# Patient Record
Sex: Male | Born: 1980
Health system: Southern US, Community
[De-identification: ages and names within clinical notes are randomized; demographics above are authoritative.]

## PROBLEM LIST (undated history)

## (undated) DIAGNOSIS — K529 Noninfective gastroenteritis and colitis, unspecified: Secondary | ICD-10-CM

---

## 2002-01-06 HISTORY — PX: ANTERIOR CRUCIATE LIGAMENT REPAIR: SHX115

## 2005-04-21 ENCOUNTER — Ambulatory Visit (HOSPITAL_COMMUNITY): Admission: RE | Admit: 2005-04-21 | Discharge: 2005-04-21 | Payer: Self-pay | Admitting: Family Medicine

## 2012-08-16 ENCOUNTER — Ambulatory Visit (HOSPITAL_COMMUNITY)
Admission: RE | Admit: 2012-08-16 | Discharge: 2012-08-16 | Disposition: A | Discharge status: Home / Self Care | Payer: BC Managed Care – PPO | Source: Ambulatory Visit | Attending: Family Medicine | Admitting: Family Medicine

## 2012-08-16 ENCOUNTER — Encounter: Payer: Self-pay | Admitting: Family Medicine

## 2012-08-16 ENCOUNTER — Ambulatory Visit (INDEPENDENT_AMBULATORY_CARE_PROVIDER_SITE_OTHER): Payer: BC Managed Care – PPO | Admitting: Family Medicine

## 2012-08-16 VITALS — BP 122/88 | HR 70 | Ht 68.0 in | Wt 190.4 lb

## 2012-08-16 DIAGNOSIS — Z9889 Other specified postprocedural states: Secondary | ICD-10-CM | POA: Insufficient documentation

## 2012-08-16 DIAGNOSIS — M25569 Pain in unspecified knee: Secondary | ICD-10-CM

## 2012-08-16 DIAGNOSIS — M25562 Pain in left knee: Secondary | ICD-10-CM

## 2012-08-16 NOTE — Progress Notes (Signed)
  Subjective:    Patient ID: Antonio Gallegos, male    DOB: November 02, 1980, 32 y.o.   MRN: [id number]  Knee Pain  The incident occurred more than 1 week ago. The incident occurred at home. There was no injury mechanism (Patient had anterior cruciate ligament reconstruction 2004.). The pain is present in the left knee. The quality of the pain is described as aching and shooting. The pain is at a severity of 5/10. The pain is moderate. The pain has been constant since onset. Associated symptoms include a loss of motion. The symptoms are aggravated by movement. He has tried acetaminophen for the symptoms. The treatment provided no relief.   This patient states when he tries to golf when he tries to sprint or twist he relates severe pain in the knee. He also relates that the knee gives way. gives way. He also relates a lot of popping in the knee and at times it locks on him. He denies any numbness down into the ankle.  PMH previous knee injury. Patient would like to get to the point where he can exercise without trouble.   Review of Systems See above    Objective:   Physical Exam Thighs normal calf is normal ankle normal moderate tenderness in the knee with significant crepitus plus also clunk on examination with flexion and internal rotation       Assessment & Plan:  Probable cartilage damage we will do plain x-rays may well need to have MRI as well consider possibility of anti-inflammatory trial patient may well end up needing orthopedic referral

## 2012-08-17 ENCOUNTER — Other Ambulatory Visit: Payer: Self-pay | Admitting: Family Medicine

## 2012-08-17 DIAGNOSIS — M25562 Pain in left knee: Secondary | ICD-10-CM

## 2012-09-01 ENCOUNTER — Encounter: Payer: Self-pay | Admitting: Orthopedic Surgery

## 2012-09-01 ENCOUNTER — Ambulatory Visit (INDEPENDENT_AMBULATORY_CARE_PROVIDER_SITE_OTHER): Payer: BC Managed Care – PPO | Admitting: Orthopedic Surgery

## 2012-09-01 VITALS — BP 122/75 | Ht 68.0 in | Wt 187.0 lb

## 2012-09-01 DIAGNOSIS — S83242A Other tear of medial meniscus, current injury, left knee, initial encounter: Secondary | ICD-10-CM

## 2012-09-01 DIAGNOSIS — IMO0002 Reserved for concepts with insufficient information to code with codable children: Secondary | ICD-10-CM

## 2012-09-01 NOTE — Progress Notes (Signed)
Patient ID: Antonio Gallegos, male   DOB: 10/12/1980, 32 y.o.   MRN: [id number]  Chief Complaint  Patient presents with  . Knee Pain    Left knee pain d/t re- injury back in October 2013. referral from Dr. Lilyan Punt.    32 year old male had a still reconstruction secondary to a noncontact basketball injury in 2004. The surgery was done in [place]. He had hamstring graft with a cross BX femoral fixation he did well. He had lateral meniscal tear as well and partial lateral meniscectomy.  He's been doing reasonably well over the years able to return to sporting activity and exercise routines including weight lifting squatting etc. He reinjured himself in 2007 but doesn't remember the injury, we have an MRI showing that the graft was intact at that time.  In October 2013 he was exercising with some weighted squats he felt a catching sensation and had severe swelling in the knee when he was golfing the next day he noted a crack or pop in the knee and he  had trouble for 2 weeks  After that he got better but he notices that he can't pivot turn or rotate on the knee. He now has to use a lighter amount of weights when squatting. Review of systems negative except for seasonal allergies  He has no known drug allergies no medical problems and his anterior cruciate ligament reconstruction left knee as stated. Family history of heart disease, arthritis, cancer and asthma. 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   Range of motion is full without contracture.  Motor exam is normal with grade 5 strength.  The joints are fully reduced without subluxation.  There is no atrophy or tremor and muscle tone is normal.  All joints are stable.  Right knee normal alignment no effusion  full range of motion stability tests were normal with a possible clot on the pivot maneuver but stable strength normal skin normal pulses normal lymph nodes normal sensation normal  Left knee a medial scar from the hamstring graft is noted its larger than what we normally see but nothing out of the ordinary per se he also has a scar across the front of the leg a little lower and more lateral to that. He has no effusion the knee today is no medial or lateral joint line tenderness. His Lachman test shows slight more translation than the opposite side but comes to a firm endpoint he does have a pivot shift grade 1 versus a glide on the opposite side full range of motion is noted. Strength is normal pulses are good lymph nodes negative sensation intact McMurray's sign is positive for medial meniscal tear   No pathologic reflexes normal coordination and balance  X-ray shows slightly anterior tibial graft but otherwise normal there is a screw from a tea fix her cross fix fixation on the femoral side  2007 MRI shows decrease meniscal tissue consistent with lateral meniscectomy partial. 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

## 2012-09-01 NOTE — Patient Instructions (Signed)
MRI Ordered. 

## 2012-09-02 ENCOUNTER — Telehealth: Payer: Self-pay | Admitting: *Deleted

## 2012-09-02 NOTE — Telephone Encounter (Signed)
Antonio Gallegos called and said, he is still working with his insurance company to find a  facility that they will pay for. I told him i would hold off on pre-certing his MRI until I heard back from him. I did explain to Antonio Gallegos that I would be out of the office until Tuesday.

## 2012-09-07 ENCOUNTER — Telehealth: Payer: Self-pay | Admitting: Orthopedic Surgery

## 2012-09-07 NOTE — Telephone Encounter (Signed)
Patient called back to relay that he has researched some MRI facilities regarding in-network providers and also based on cost/copay; he requests MRI to be done at [facility] in [place], [facility] # [phone], if his insurance approves. Relayed we will contact insurer [employer] and proceed with process, and call him with further information.

## 2012-09-08 ENCOUNTER — Telehealth: Payer: Self-pay | Admitting: *Deleted

## 2012-09-08 ENCOUNTER — Other Ambulatory Visit: Payer: Self-pay | Admitting: *Deleted

## 2012-09-08 NOTE — Progress Notes (Signed)
Error

## 2012-09-08 NOTE — Telephone Encounter (Signed)
No per-cert required for MRI per [name] A with [employer]. MRI scheduled at [facility] for 09/09/12 at 8:30 pm, per patient's request. Scheduled for follow up with Dr. Romeo Apple 09/20/12 at 9:00 am Patient is aware of dates and times

## 2012-09-09 NOTE — Telephone Encounter (Signed)
Refer to notes entered by Debby Bud, LPN regarding scheduling of MRI at this facility. Patient aware.

## 2012-09-20 ENCOUNTER — Ambulatory Visit (INDEPENDENT_AMBULATORY_CARE_PROVIDER_SITE_OTHER): Payer: BC Managed Care – PPO | Admitting: Orthopedic Surgery

## 2012-09-20 VITALS — BP 126/78 | Ht 68.0 in | Wt 187.0 lb

## 2012-09-20 DIAGNOSIS — M1710 Unilateral primary osteoarthritis, unspecified knee: Secondary | ICD-10-CM

## 2012-09-20 DIAGNOSIS — M179 Osteoarthritis of knee, unspecified: Secondary | ICD-10-CM

## 2012-09-20 DIAGNOSIS — Z9889 Other specified postprocedural states: Secondary | ICD-10-CM

## 2012-09-20 DIAGNOSIS — IMO0002 Reserved for concepts with insufficient information to code with codable children: Secondary | ICD-10-CM

## 2012-09-20 NOTE — Patient Instructions (Addendum)
CONTINUE TO MONITOR KNEE FOR 6 WEEKS, TRY GOLFING WATCH FOR LOCKING, CATCHING IF NO BETTER CALL OUR OFFICE GLUCOSAMINE AND CHONDROITIN

## 2012-09-21 ENCOUNTER — Encounter: Payer: Self-pay | Admitting: Orthopedic Surgery

## 2012-09-21 DIAGNOSIS — Z9889 Other specified postprocedural states: Secondary | ICD-10-CM | POA: Insufficient documentation

## 2012-09-21 DIAGNOSIS — M1710 Unilateral primary osteoarthritis, unspecified knee: Secondary | ICD-10-CM | POA: Insufficient documentation

## 2012-09-21 DIAGNOSIS — M179 Osteoarthritis of knee, unspecified: Secondary | ICD-10-CM | POA: Insufficient documentation

## 2012-09-21 NOTE — Progress Notes (Signed)
Patient ID: Antonio Gallegos, male   DOB: October 02, 1980, 32 y.o.   MRN: [id number]  Followup MRI recheck knee  MRI OF THE LEFT KNEE WITHOUT CONTRAST - 04/21/05:    Technique: Multiplanar and multisequence MR imaging of the left knee was performed following the standard protocol. No intravenous contrast was administered.    There are no comparison studies available.   Findings: Postsurgical changes secondary to ACL repair are noted. The graft is intact. There is mild edema along the graft which may reflect ligamentous sprain. There is an acute bone contusion along the lateral femoral condyle with associated osteochondral defect. A focal articular cartilage defect is noted without unsteady osseous fragment. The lateral meniscus is markedly diminutive, attributed to prior partial meniscectomy. There is no definite recurrent meniscal tear appreciated. The lateral collateral ligament complex is intact.   Within the medial compartment, the meniscus has a normal appearance without tear. The MCL is intact. There is cartilage thinning without full-thickness cartilage loss.   The patella is in normal position.   IMPRESSION:    1. ACL graft remains intact. There is edema along the graft suggesting sprain. Extensive bone contusion along the lateral femoral condyle is noted with overlying osteocartilage injury. No unstable osseous component.    2. The lateral meniscus is notably diminutive. There is no definite recurrent meniscal tear or displaced meniscal fragments.    BP 126/78  Ht 5\' 8"  (1.727 m)  Wt 187 lb (84.823 kg)  BMI 28.44 kg/m2 General appearance is normal, the patient is alert and oriented x3 with normal mood and affect. Knee exam essentially unchanged joint line symptoms are modest stability of the knee shows mild laxity does not appear to be pathologic neurovascular exam intact  Review MRI and discussion with patient I think he  can avoid surgery at this point but as we discussed he may need surgery in  the future perhaps debridement of his joint. At his age of 32 is not a candidate for surgery in terms of replacement  His cartilaginous deficits in defects which will progress over time. Recommend over-the-counter chondral supplements at this point maintain knee stability by strengthening the knee and continue distended shape and keep his weight down  Followup as needed look for catching locking giving way mechanical symptoms if he hasn't made to call the office back he understands this plan

## 2012-10-08 ENCOUNTER — Encounter: Payer: Self-pay | Admitting: Orthopedic Surgery

## 2013-03-29 ENCOUNTER — Ambulatory Visit (INDEPENDENT_AMBULATORY_CARE_PROVIDER_SITE_OTHER): Payer: BC Managed Care – PPO | Admitting: Family Medicine

## 2013-03-29 ENCOUNTER — Encounter: Payer: Self-pay | Admitting: Family Medicine

## 2013-03-29 VITALS — BP 118/72 | Ht 68.0 in | Wt 194.2 lb

## 2013-03-29 DIAGNOSIS — R197 Diarrhea, unspecified: Secondary | ICD-10-CM

## 2013-03-29 DIAGNOSIS — R109 Unspecified abdominal pain: Secondary | ICD-10-CM

## 2013-03-29 LAB — CBC WITH DIFFERENTIAL/PLATELET
BASOS ABS: 0 10*3/uL (ref 0.0–0.1)
BASOS PCT: 0 % (ref 0–1)
EOS PCT: 1 % (ref 0–5)
Eosinophils Absolute: 0.1 10*3/uL (ref 0.0–0.7)
HEMATOCRIT: 39.8 % (ref 39.0–52.0)
HEMOGLOBIN: 13.5 g/dL (ref 13.0–17.0)
LYMPHS PCT: 25 % (ref 12–46)
Lymphs Abs: 1.5 10*3/uL (ref 0.7–4.0)
MCH: 29.8 pg (ref 26.0–34.0)
MCHC: 33.9 g/dL (ref 30.0–36.0)
MCV: 87.9 fL (ref 78.0–100.0)
MONO ABS: 0.5 10*3/uL (ref 0.1–1.0)
Monocytes Relative: 8 % (ref 3–12)
NEUTROS ABS: 3.8 10*3/uL (ref 1.7–7.7)
NEUTROS PCT: 66 % (ref 43–77)
Platelets: 253 10*3/uL (ref 150–400)
RBC: 4.53 MIL/uL (ref 4.22–5.81)
RDW: 13.5 % (ref 11.5–15.5)
WBC: 5.8 10*3/uL (ref 4.0–10.5)

## 2013-03-29 LAB — SEDIMENTATION RATE: Sed Rate: 5 mm/hr (ref 0–16)

## 2013-03-29 LAB — HEPATIC FUNCTION PANEL
ALBUMIN: 4.3 g/dL (ref 3.5–5.2)
ALK PHOS: 52 U/L (ref 39–117)
ALT: 25 U/L (ref 0–53)
AST: 28 U/L (ref 0–37)
BILIRUBIN DIRECT: 0.1 mg/dL (ref 0.0–0.3)
Indirect Bilirubin: 0.3 mg/dL (ref 0.2–1.2)
Total Bilirubin: 0.4 mg/dL (ref 0.2–1.2)
Total Protein: 7 g/dL (ref 6.0–8.3)

## 2013-03-29 NOTE — Progress Notes (Signed)
   Subjective:    Patient ID: Antonio Gallegos, male    DOB: 01-03-1981, 33 y.o.   MRN: [id number]  HPI There is no prior history of this problem Patient arrives with complaint of abd pain for few months. Patient reports indigestion and diarrhea. No nausea Tried Immodium- slowed it a little Feeling OK No camping, denies any severe abdominal pain discomfort denies bloody stools denies mucousy stools. States even tried stopping milk products periodically for couple weeks and it does seem to help. Denies being under stress denies being depressed or anxious No night sweats No travel, Review of Systems  Constitutional: Negative for fever, activity change, appetite change and fatigue.  HENT: Negative for congestion.   Respiratory: Negative for cough, choking and shortness of breath.   Cardiovascular: Negative for chest pain.  Gastrointestinal: Positive for diarrhea. Negative for vomiting, abdominal pain, constipation, blood in stool and rectal pain.  Genitourinary: Negative for frequency and flank pain.  Musculoskeletal: Negative for arthralgias.  Skin: Negative for rash.  Neurological: Negative for headaches.   family history benign.     Objective:   Physical Exam  Vitals reviewed. Constitutional: He appears well-nourished. No distress.  Cardiovascular: Normal rate, regular rhythm and normal heart sounds.   No murmur heard. Pulmonary/Chest: Effort normal and breath sounds normal. No respiratory distress.  Abdominal: Soft. He exhibits no distension. There is no tenderness. There is no rebound.  Musculoskeletal: He exhibits no edema.  Lymphadenopathy:    He has no cervical adenopathy.  Neurological: He is alert.  Psychiatric: His behavior is normal.          Assessment & Plan:  Diarrhea-seemingly it seems a little bit better than what it was. We will go ahead and do some testing. If this gets worse over the course of next couple weeks he will need a stool culture, stool WBC, O&P.  And if that does not detail out the problem gastroenterology referral. Lab work was ordered to help rule out celiac disease also lab work ordered to check on couple different items. 25 minutes was spent with the patient regarding this issue. Moderate to significant complexity.

## 2013-03-30 LAB — TISSUE TRANSGLUTAMINASE, IGA: Tissue Transglutaminase Ab, IgA: 8.6 U/mL (ref <20)

## 2013-04-06 NOTE — Progress Notes (Signed)
Patient notified and verbalized understanding. He is dropping off his stool samples to the lab today. He is still having loose stools. I said for him to call us back Monday the 6th if he doesn't here back from [place] and if he is still having loose stools.

## 2013-04-07 ENCOUNTER — Telehealth: Payer: Self-pay | Admitting: Family Medicine

## 2013-04-07 ENCOUNTER — Other Ambulatory Visit (INDEPENDENT_AMBULATORY_CARE_PROVIDER_SITE_OTHER): Payer: BC Managed Care – PPO | Admitting: *Deleted

## 2013-04-07 DIAGNOSIS — R197 Diarrhea, unspecified: Secondary | ICD-10-CM

## 2013-04-07 DIAGNOSIS — R109 Unspecified abdominal pain: Secondary | ICD-10-CM

## 2013-04-07 NOTE — Telephone Encounter (Signed)
Stool cx generally take several days to return, one night of sweats will not change approach, rec waiting for stool results before further intervent and this gives time to see if symptoms persist

## 2013-04-07 NOTE — Telephone Encounter (Signed)
Patient was seen on 3/24 by Dr. Lorin PicketScott for loose stools. He dropped off his stool culture yesterday to the lab. Dr. Lorin PicketScott told Loraine LericheMark to let us know if he wakes up in sweats throughout the night and last night he did.

## 2013-04-07 NOTE — Telephone Encounter (Signed)
Patient came in for a stomach issue that he has had.  He said at that time, he had not been waking up in sweats at night, but last night and he did and he feels like it is getting worse now. Please advise.

## 2013-04-07 NOTE — Telephone Encounter (Signed)
Patient called back. I see that he never got the tubes for the stool culture. I see Dr. Roby Lofts note from visit. I went ahead and put the orders in for a stool culture, stool WBC, and O&P. I notified [employer] and he is coming to pick up the tubes for the culture.

## 2013-04-07 NOTE — Telephone Encounter (Signed)
Left VM on patient's cell phone with Dr. Soyla Dryer response.

## 2013-04-08 LAB — POC HEMOCCULT BLD/STL (HOME/3-CARD/SCREEN)
Card #3 Fecal Occult Blood, POC: NEGATIVE
FECAL OCCULT BLD: NEGATIVE
Fecal Occult Blood, POC: NEGATIVE

## 2013-04-13 LAB — FECAL LACTOFERRIN, QUANT: LACTOFERRIN: POSITIVE

## 2013-04-15 ENCOUNTER — Telehealth: Payer: Self-pay | Admitting: Family Medicine

## 2013-04-15 NOTE — Telephone Encounter (Signed)
Hemoccult test is negative, awaiting stool culture.

## 2013-04-15 NOTE — Telephone Encounter (Signed)
Patient came in about stomach issues last month and he said he dropped his tests off. He would like for a nurse to call him back to update the status of his condition.

## 2013-04-16 LAB — STOOL CULTURE

## 2013-04-19 NOTE — Telephone Encounter (Signed)
This patient's stool culture is negative I recommend that we go ahead and set him up with gastroenterology. Please put in referral for whoever he prefers. If his condition has totally cleared up then referral is not necessary.

## 2013-04-20 NOTE — Telephone Encounter (Signed)
Patient stated it has gotten a lot better -not 100 percent but a lot better and wants to hold on GI referral at this time. Patient to call back if further problems or if he wants to proceed with referral.

## 2013-04-20 NOTE — Telephone Encounter (Signed)
Left message to return call 

## 2013-04-25 ENCOUNTER — Telehealth: Payer: Self-pay | Admitting: Family Medicine

## 2013-04-25 ENCOUNTER — Other Ambulatory Visit: Payer: Self-pay | Admitting: Family Medicine

## 2013-04-25 DIAGNOSIS — R109 Unspecified abdominal pain: Secondary | ICD-10-CM

## 2013-04-25 NOTE — Telephone Encounter (Signed)
A referral was put into the system. Please talk with the patient. Confirm [facility] Dr. Karilyn Cotaehman? Dr. Kendell Baneourke? We can go ahead and get this set up.

## 2013-04-25 NOTE — Telephone Encounter (Signed)
Pt didn't have a preference between rourke or rehman. Whichever one your think is best. He also wanted to let u know. He has been waking at night with sweats. Started about 3 weeks ago. Happens about once a week. He stated you asked him about this at his appt but he was not having this at that time.

## 2013-04-25 NOTE — Telephone Encounter (Signed)
[facility] 04/25/13 

## 2013-04-25 NOTE — Telephone Encounter (Signed)
Patient would like to go ahead with referral to GI.

## 2013-04-26 ENCOUNTER — Encounter: Payer: Self-pay | Admitting: Gastroenterology

## 2013-05-26 ENCOUNTER — Ambulatory Visit (INDEPENDENT_AMBULATORY_CARE_PROVIDER_SITE_OTHER): Payer: BC Managed Care – PPO | Admitting: Gastroenterology

## 2013-05-26 ENCOUNTER — Encounter: Payer: Self-pay | Admitting: Gastroenterology

## 2013-05-26 VITALS — BP 137/76 | HR 50 | Temp 97.4°F | Ht 68.0 in | Wt 186.6 lb

## 2013-05-26 DIAGNOSIS — R195 Other fecal abnormalities: Secondary | ICD-10-CM | POA: Insufficient documentation

## 2013-05-26 LAB — TSH: TSH: 1.722 u[IU]/mL (ref 0.350–4.500)

## 2013-05-26 MED ORDER — DICYCLOMINE HCL 10 MG PO CAPS: 10.0000 mg | ORAL_CAPSULE | Freq: Three times a day (TID) | ORAL | Status: DC

## 2013-05-26 NOTE — Assessment & Plan Note (Signed)
33 year old pleasant male with significant change in bowel habits for the past 4 months, noting chronic, non-bloody loose stool. Thus far, stool culture negative but with a non-specific positive lactoferrin. Mild abdominal cramping noted but not significant; he has not noticed any aggravating factors but does note some improvement with Imodium. Doubt infectious process such as Cdiff, but we will check this by PCR to be thorough. I have also ordered an IgA level to assess for IgA deficiency, which could render a false negative TTg, IgA. Add TSH to labs, although I doubt thyroid abnormalities are any issue here. Likely dealing with chronic diarrhea, possible IBS, doubt IBD. We discussed adding Bentyl for supportive measures, probiotic daily, and pursuing a flex sig vs colonoscopy if no improvement with supportive measures. He is agreeable to this plan.

## 2013-05-26 NOTE — Progress Notes (Signed)
Referring Provider: Babs Sciara, MD Primary Care Physician:  Lilyan Punt, MD Primary Gastroenterologist:  Dr. Darrick Penna   Chief Complaint  Patient presents with  . Abdominal Pain    HPI:   Antonio Gallegos presents today at the request of Dr. Lilyan Punt secondary to diarrhea X 4 months. Prior to this 1 soft BM a day. Thus far, stool culture negative and lactoferrin positive. Hemoccult negative X 3. CBC normal. TTg, IgA normal. No IgA drawn. No changes in medication, no changes in diet. Denies any stress. No rectal bleeding. Sometimes loose stools 2-3 times per day, sometimes up to 4 times per day. Usually watery. Would have occasional, rare formed bowel movements for a few days but then right back to watery. Postprandial component. No abx exposure. Well water. No sick contacts. No significant abdominal pain. Sometimes mild abdominal cramping but not severe. No weight loss. Good appetite.   History reviewed. No pertinent past medical history.  Past Surgical History  Procedure Laterality Date  . Anterior cruciate ligament repair Left 2004    [facility]    No current outpatient prescriptions on file.   No current facility-administered medications for this visit.    Allergies as of 05/26/2013 - Review Complete 05/26/2013  Allergen Reaction Noted  . Other  08/16/2012    Family History  Problem Relation Age of Onset  . Colon cancer Neg Hx   . Celiac disease Neg Hx     History   Social History  . Marital Status: Single    Spouse Name: N/A    Number of Children: N/A  . 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 Smoking status: Former Games developer  . Smokeless tobacco: Not on file  . Alcohol Use: Yes     Comment: Ocassionally  . Drug Use: No  . Sexual Activity: Not on file   Other Topics Concern  . Not on file   Social History Narrative  . No narrative on file    Review of  Systems: As mentioned in HPI  Physical Exam: BP 137/76  Pulse 50  Temp(Src) 97.4 F (36.3 C) (Oral)  Ht 5\' 8"  (1.727 m)  Wt 186 lb 9.6 oz (84.641 kg)  BMI 28.38 kg/m2 General:   Alert and oriented. Well-developed, well-nourished, pleasant and cooperative. Head:  Normocephalic and atraumatic. Eyes:  Conjunctiva pink, sclera clear, no icterus.   Conjunctiva pink. Ears:  Normal auditory acuity. Nose:  No deformity, discharge,  or lesions. Mouth:  No deformity or lesions, mucosa pink and moist.  Lungs:  Clear to auscultation bilaterally, without wheezing, rales, or rhonchi.  Heart:  S1, S2 present without murmurs noted.  Abdomen:  +BS, soft, non-tender and non-distended. Without mass or HSM. No rebound or guarding. No hernias noted. Rectal:  Deferred  Msk:  Symmetrical without gross deformities. Normal posture. Extremities:  Without clubbing or edema. Neurologic:  Alert and  oriented x4;  grossly normal neurologically. Skin:  Intact, warm and dry without significant lesions or rashes Psych:  Alert and cooperative. Normal mood and affect.   Lab Results  Component Value Date   WBC 5.8 03/29/2013   HGB 13.5 03/29/2013   HCT 39.8 03/29/2013   MCV 87.9 03/29/2013   PLT 253 03/29/2013   Lab Results  Component Value Date   ALT 25 03/29/2013   AST 28 03/29/2013   ALKPHOS 52 03/29/2013   BILITOT  0.4 03/29/2013     

## 2013-05-26 NOTE — Patient Instructions (Signed)
Please complete the stool sample and return to the lab. I have also ordered additional blood work to be thorough.  I have sent Bentyl to your pharmacy. Take 1 with meals and at bedtime, up to 4 times a day. You can tailor this to fit your needs. Watch for dry mouth, constipation, dizziness. If you have constipation, back off on Bentyl.  Start taking a probiotic daily such as Digestive Advantage, Phillip's Colon Health, Restora, or Align.  Please call in 2 weeks with an update or call sooner if you have any worsening of symptoms or see rectal bleeding.

## 2013-05-27 LAB — IGA: IgA: 402 mg/dL — ABNORMAL HIGH (ref 68–379)

## 2013-06-01 NOTE — Progress Notes (Signed)
Quick Note:  Yes. Have him do Cdiff just to be thorough.  May increase Bentyl to 3-4 times a day. ______

## 2013-06-01 NOTE — Progress Notes (Signed)
Quick Note:  LMOM for a return call. ______ 

## 2013-06-01 NOTE — Progress Notes (Signed)
cc'd to pcp 

## 2013-06-01 NOTE — Progress Notes (Signed)
Quick Note:  TSH normal.  No IgA deficiency. No serologic evidence of celiac disease.  Needs to complete Cdiff PCR. How is he doing? ______

## 2013-06-24 LAB — CLOSTRIDIUM DIFFICILE BY PCR: CDIFFPCR: NOT DETECTED

## 2013-06-29 NOTE — Progress Notes (Signed)
Quick Note:  Pt has appt with Gerrit Halls, NP tomorrow at 2:30 PM. ______

## 2013-06-29 NOTE — Progress Notes (Signed)
Quick Note:  Cdiff PCR negative.  I left message on patient's home phone to call. Hopefully, he will continue to improve with Bentyl. ______

## 2013-06-30 ENCOUNTER — Encounter (INDEPENDENT_AMBULATORY_CARE_PROVIDER_SITE_OTHER): Payer: Self-pay

## 2013-06-30 ENCOUNTER — Encounter: Payer: Self-pay | Admitting: Gastroenterology

## 2013-06-30 ENCOUNTER — Ambulatory Visit (INDEPENDENT_AMBULATORY_CARE_PROVIDER_SITE_OTHER): Payer: BC Managed Care – PPO | Admitting: Gastroenterology

## 2013-06-30 VITALS — BP 120/74 | HR 51 | Temp 97.8°F | Ht 68.0 in | Wt 185.6 lb

## 2013-06-30 DIAGNOSIS — R195 Other fecal abnormalities: Secondary | ICD-10-CM

## 2013-06-30 MED ORDER — HYOSCYAMINE SULFATE 0.125 MG SL SUBL: 0.1250 mg | SUBLINGUAL_TABLET | SUBLINGUAL | Status: DC | PRN

## 2013-06-30 NOTE — Progress Notes (Signed)
       Referring Provider: Babs Sciara, MD Primary Care Physician:  Lilyan Punt, MD Primary GI: Dr. Darrick Penna   Chief Complaint  Patient presents with  . Diarrhea  . Colonoscopy    HPI:   Pleasant 33 year old male returns today to discuss GI symptoms. Originally seen in May 26, 2013 due to loose stool. No abdominal pain. Good days and bad days. Not gurgling as much as it used to. Used to have postprandial urgency. Occasional bloating but some improvement. Sporadic probiotic. Didn't seem to help much. Stool culture negative, lactoferrin positive. Cdiff PCR negative. CBC normal, celiac serologies normal. Bentyl helped about 50%. Taking as needed. Felt some side effects from it. Usually average about 1 a day. Thinks dairy makes worse but still tried to avoid dairy and still had issues. Cut out yogurt, cottage cheese, cheese. No rectal bleeding. Hemoccult negative X 3. Actually felt better last few days.     No past medical history on file.  Past Surgical History  Procedure Laterality Date  . Anterior cruciate ligament repair Left 2004    [facility] ,[place]    Current Outpatient Prescriptions  Medication Sig Dispense Refill  . dicyclomine (BENTYL) 10 MG capsule Take 1 capsule (10 mg total) by mouth 4 (four) times daily -  before meals and at bedtime.  120 capsule  3  . hyoscyamine (LEVSIN SL) 0.125 MG SL tablet Place 1 tablet (0.125 mg total) under the tongue every 4 (four) hours as needed.  90 tablet  3   No current facility-administered medications for this visit.    Allergies as of 06/30/2013 - Review Complete 06/30/2013  Allergen Reaction Noted  . Other  08/16/2012    Family History  Problem Relation Age of Onset  . Colon cancer Neg Hx   . Celiac disease Neg Hx   . Lymphoma Mother     History   Social History  . Marital Status: Single    Spouse Name: N/A    Number of Children: N/A  . Years of Education: N/A   Occupational History  . [employer]    Social History Main Topics  . Smoking status: Former Games developer  . Smokeless tobacco: None  . Alcohol Use: Yes     Comment: Ocassionally  . Drug Use: No  . Sexual Activity: None   Other Topics Concern  . None   Social History Narrative  . None    Review of Systems: As mentioned in HPI.   Physical Exam: BP 120/74  Pulse 51  Temp(Src) 97.8 F (36.6 C) (Oral)  Ht 5\' 8"  (1.727 m)  Wt 185 lb 9.6 oz (84.188 kg)  BMI 28.23 kg/m2 General:   Alert and oriented. No distress noted. Pleasant and cooperative.  Head:  Normocephalic and atraumatic. Eyes:  Conjuctiva clear without scleral icterus. Mouth:  Oral mucosa pink and moist. Good dentition. No lesions. Abdomen:  +BS, soft, non-tender and non-distended. No rebound or guarding. No HSM or masses noted. Msk:  Symmetrical without gross deformities. Normal posture. Pulses:  2+ DP noted bilaterally Neurologic:  Alert and  oriented x4;  grossly normal neurologically. Skin:  Intact without significant lesions or rashes. Psych:  Alert and cooperative. Normal mood and affect.

## 2013-06-30 NOTE — Patient Instructions (Signed)
Stop Bentyl. I have sent a medication called Levsin to your pharmacy. This can be taken as needed for cramps and loose stool.   You may take Imodium each morning to every other day; you can "play around" with this dosing for what works for you. Make sure you avoid constipation.   I have given you samples of an over the counter agent called IBgard to try.   Please call me if you have worsening of symptoms or no improvement; if you have any rectal bleeding, call right away.   We will see you as needed in the future, but a colonoscopy can be considered if things do not continue to improve with diet and medication changes.   Have a great summer!

## 2013-06-30 NOTE — Assessment & Plan Note (Signed)
With negative stool studies thus far except for non-specific positive lactoferrin. Some overall improvement with Bentyl and limiting dairy. No concerning signs such as rectal bleeding. Thus far, all labs have been normal. Discussed proceeding with a colonoscopy versus symptomatic/supportive treatment. Patient favors the latter, which is appropriate in this situation. Discussed the need for colonoscopy if no improvement, worsening of symptoms, abdominal pain, or rectal bleeding. He is agreeable and would rather avoid a colonoscopy if possible.  Add Levsin, stop Bentyl Avoid dairy IBgard trial Return prn Patient to call with update if any changes

## 2013-07-04 NOTE — Progress Notes (Signed)
cc'd to pcp 

## 2013-07-06 NOTE — Progress Notes (Signed)
Quick Note:  Pt came in for office visit with Tobi Bastos on 06/30/2013. ______

## 2013-08-06 DIAGNOSIS — K529 Noninfective gastroenteritis and colitis, unspecified: Secondary | ICD-10-CM

## 2013-08-06 HISTORY — DX: Noninfective gastroenteritis and colitis, unspecified: K52.9

## 2013-08-07 ENCOUNTER — Encounter: Payer: Self-pay | Admitting: Gastroenterology

## 2013-08-23 ENCOUNTER — Telehealth: Payer: Self-pay | Admitting: Gastroenterology

## 2013-08-23 NOTE — Telephone Encounter (Signed)
I have added patient to the schedule for 8/26 at 1130 with AS and mailed appt card to patient.

## 2013-08-23 NOTE — Telephone Encounter (Signed)
Patient without improvement with supportive measures for presumed IBS.  Needs OV with me. I have offered Aug 26th at 11:30. Please put him in that slot. However, if he is unable to make it, he will be calling us back. Just needs an appt in the next 2 weeks.

## 2013-08-31 ENCOUNTER — Ambulatory Visit (INDEPENDENT_AMBULATORY_CARE_PROVIDER_SITE_OTHER): Payer: BC Managed Care – PPO | Admitting: Gastroenterology

## 2013-08-31 ENCOUNTER — Encounter (HOSPITAL_COMMUNITY): Payer: Self-pay | Admitting: Pharmacy Technician

## 2013-08-31 ENCOUNTER — Other Ambulatory Visit: Payer: Self-pay | Admitting: Gastroenterology

## 2013-08-31 ENCOUNTER — Encounter: Payer: Self-pay | Admitting: Gastroenterology

## 2013-08-31 VITALS — BP 129/62 | HR 52 | Temp 96.7°F | Ht 68.0 in | Wt 180.8 lb

## 2013-08-31 DIAGNOSIS — K589 Irritable bowel syndrome without diarrhea: Secondary | ICD-10-CM

## 2013-08-31 DIAGNOSIS — R195 Other fecal abnormalities: Secondary | ICD-10-CM

## 2013-08-31 MED ORDER — PEG 3350-KCL-NA BICARB-NACL 420 G PO SOLR: 4000.0000 mL | ORAL | Status: DC

## 2013-08-31 NOTE — Patient Instructions (Signed)
We have scheduled you for a colonoscopy with Dr. Darrick Penna in the near future.  Further recommendations to follow.  Let me know if you have any questions in the meantime!

## 2013-08-31 NOTE — Progress Notes (Signed)
    Referring Provider: Babs Sciara, MD Primary Care Physician:  Lilyan Punt, MD Primary GI: Dr. Darrick Penna   Chief Complaint  Patient presents with  . Follow-up    HPI:   Antonio Gallegos is a pleasant 33 year old male who presents today to discuss a colonoscopy secondary to chronic diarrhea. He has been seen by myself on 2 prior occassions, with first consultation in May 2015. He has had negative work-up thus far to include stool cultures, Cdiff PCR, normal CBC, normal celiac serologies. Lactoferrin positive. Has trialed both Bentyl and Levsin.   No food jumps out. No abdominal pain. No rectal bleeding. 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 Takes Levsin prn. No real improvement with either agent. Has cut out dairy in the past without improvement. Feels diarrhea has been worsening. Present since Jan this year. Would like to pursue colonoscopy to assess for any underlying issues.     No significant PMH  Past Surgical History  Procedure Laterality Date  . Anterior cruciate ligament repair Left 2004    [facility]    Current Outpatient Prescriptions  Medication Sig Dispense Refill  . hyoscyamine (LEVSIN, ANASPAZ) 0.125 MG tablet Take 0.125 mg by mouth every 4 (four) hours as needed.       No current facility-administered medications for this visit.    Allergies as of 08/31/2013 - Review Complete 08/31/2013  Allergen Reaction Noted  . Other  08/16/2012    Family History  Problem Relation Age of Onset  . Colon cancer Neg Hx   . Celiac disease Neg Hx   . Lymphoma Mother     History   Social History  . Marital Status: Single    Spouse Name: N/A    Number of Children: N/A  . 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 Smoking status: Former Games developer  . Smokeless tobacco: None     Comment: Quit x 10 years  . Alcohol Use: Yes   Comment: Ocassionally  . Drug Use: No  . Sexual Activity: None   Other Topics Concern  . None   Social History Narrative  . None    Review of Systems: Negative unless mentioned in HPI.   Physical Exam: BP 129/62  Pulse 52  Temp(Src) 96.7 F (35.9 C) (Oral)  Ht  (1.727 m)  Wt 180 lb 12.8 oz (82.01 kg)  BMI 27.50 kg/m2 General:   Alert and oriented. No distress noted. Pleasant and cooperative.  Head:  Normocephalic and atraumatic. Eyes:  Conjuctiva clear without scleral icterus. Mouth:  Oral mucosa pink and moist. Good dentition. No lesions. Heart:  S1, S2 present without murmurs, rubs, or gallops. Regular rate and rhythm. Abdomen:  +BS, soft, non-tender and non-distended. No rebound or guarding. No HSM or masses noted. Msk:  Symmetrical without gross deformities. Normal posture. Extremities:  Without edema. Neurologic:  Alert and  oriented x4;  grossly normal neurologically. Skin:  Intact without significant lesions or rashes. Cervical Nodes:  No significant cervical adenopathy. Psych:  Alert and cooperative. Normal mood and affect.

## 2013-09-05 ENCOUNTER — Ambulatory Visit (HOSPITAL_COMMUNITY)
Admission: RE | Admit: 2013-09-05 | Discharge: 2013-09-05 | Disposition: A | Discharge status: Home / Self Care | Payer: BC Managed Care – PPO | Source: Ambulatory Visit | Attending: Gastroenterology | Admitting: Gastroenterology

## 2013-09-05 ENCOUNTER — Encounter (HOSPITAL_COMMUNITY)
Admission: RE | Disposition: A | Discharge status: Home / Self Care | Payer: Self-pay | Source: Ambulatory Visit | Attending: Gastroenterology

## 2013-09-05 ENCOUNTER — Encounter (HOSPITAL_COMMUNITY): Payer: Self-pay | Admitting: *Deleted

## 2013-09-05 DIAGNOSIS — R197 Diarrhea, unspecified: Secondary | ICD-10-CM | POA: Diagnosis present

## 2013-09-05 DIAGNOSIS — Z87891 Personal history of nicotine dependence: Secondary | ICD-10-CM | POA: Insufficient documentation

## 2013-09-05 DIAGNOSIS — R195 Other fecal abnormalities: Secondary | ICD-10-CM

## 2013-09-05 DIAGNOSIS — K5289 Other specified noninfective gastroenteritis and colitis: Secondary | ICD-10-CM | POA: Diagnosis not present

## 2013-09-05 DIAGNOSIS — K589 Irritable bowel syndrome without diarrhea: Secondary | ICD-10-CM

## 2013-09-05 HISTORY — DX: Noninfective gastroenteritis and colitis, unspecified: K52.9

## 2013-09-05 HISTORY — PX: COLONOSCOPY: SHX5424

## 2013-09-05 SURGERY — COLONOSCOPY
Anesthesia: Moderate Sedation

## 2013-09-05 MED ORDER — SODIUM CHLORIDE 0.9 % IV SOLN
INTRAVENOUS | Status: DC
  Administered 2013-09-05: 13:00:00 via INTRAVENOUS

## 2013-09-05 MED ORDER — SODIUM CHLORIDE 0.9 % IJ SOLN
INTRAMUSCULAR | Status: AC
  Filled 2013-09-05: qty 10

## 2013-09-05 MED ORDER — MIDAZOLAM HCL 5 MG/5ML IJ SOLN
INTRAMUSCULAR | Status: AC
  Filled 2013-09-05: qty 10

## 2013-09-05 MED ORDER — MEPERIDINE HCL 100 MG/ML IJ SOLN
INTRAMUSCULAR | Status: DC | PRN
  Administered 2013-09-05: 50 mg via INTRAVENOUS
  Administered 2013-09-05: 25 mg via INTRAVENOUS

## 2013-09-05 MED ORDER — PROMETHAZINE HCL 25 MG/ML IJ SOLN
INTRAMUSCULAR | Status: AC
  Filled 2013-09-05: qty 1

## 2013-09-05 MED ORDER — STERILE WATER FOR IRRIGATION IR SOLN
Status: DC | PRN
  Administered 2013-09-05: 14:00:00

## 2013-09-05 MED ORDER — PROMETHAZINE HCL 25 MG/ML IJ SOLN
12.5000 mg | Freq: Once | INTRAMUSCULAR | Status: AC
  Administered 2013-09-05: 12.5 mg via INTRAVENOUS

## 2013-09-05 MED ORDER — MIDAZOLAM HCL 5 MG/5ML IJ SOLN
INTRAMUSCULAR | Status: DC | PRN
  Administered 2013-09-05: 1 mg via INTRAVENOUS
  Administered 2013-09-05 (×2): 2 mg via INTRAVENOUS

## 2013-09-05 MED ORDER — MEPERIDINE HCL 100 MG/ML IJ SOLN
INTRAMUSCULAR | Status: AC
  Filled 2013-09-05: qty 2

## 2013-09-05 MED ORDER — PREDNISONE 10 MG PO TABS: ORAL_TABLET | ORAL | Status: DC

## 2013-09-05 NOTE — Assessment & Plan Note (Signed)
33 year old male with chronic diarrhea since Jan 2015, with negative work-up thus far. Non-specific positive lactoferrin, otherwise negative stool studies. Celiac serologies negative. No significant improvement with Bentyl, Levsin, or dietary measures. No rectal bleeding. Desires colonoscopy for further evaluation, which is appropriate as he has failed supportive measures thus far.   Proceed with colonoscopy with Dr. Darrick Penna in the near future. The risks, benefits, and alternatives have been discussed in detail with the patient. They state understanding and desire to proceed.  If normal, consider 2 week course of Xifaxan

## 2013-09-05 NOTE — H&P (Signed)
  Primary Care Physician:  Lilyan Punt, MD Primary Gastroenterologist:  Dr. Darrick Penna  Pre-Procedure History & Physical: HPI:  Antonio Gallegos is a 33 y.o. male here for  DIARRHEA.  No past medical history on file.  Past Surgical History  Procedure Laterality Date  . Anterior cruciate ligament repair Left 2004    [facility] ,[place]    Prior to Admission medications   Medication Sig Start Date End Date Taking? Authorizing Provider  hyoscyamine (LEVSIN, ANASPAZ) 0.125 MG tablet Take 0.125 mg by mouth every 4 (four) hours as needed.   Yes Historical Provider, MD    Allergies as of 08/31/2013 - Review Complete 08/31/2013  Allergen Reaction Noted  . Other  08/16/2012    Family History  Problem Relation Age of Onset  . Colon cancer Neg Hx   . Celiac disease Neg Hx   . Lymphoma Mother     History   Social History  . Marital Status: Single    Spouse Name: N/A    Number of Children: N/A  . 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 Smoking status: Former Games developer  . Smokeless tobacco: Not on file     Comment: Quit x 10 years  . Alcohol Use: Yes     Comment: Ocassionally  . Drug Use: No  . Sexual Activity: Not on file   Other Topics Concern  . Not on file   Social History Narrative  . No narrative on file    Review of Systems: See HPI, otherwise negative ROS   Physical Exam: There were no vitals taken for this visit. General:   Alert,  pleasant and cooperative in NAD Head:  Normocephalic and atraumatic. Neck:  Supple; Lungs:  Clear throughout to auscultation.    Heart:  Regular rate and rhythm. Abdomen:  Soft, nontender and nondistended. Normal bowel sounds, without guarding, and without rebound.   Neurologic:  Alert and  oriented x4;  grossly normal neurologically.  Impression/Plan:     Diarrhea  PLAN: TCS TODAY WITH BIOPSY

## 2013-09-05 NOTE — Discharge Instructions (Signed)
THE LAST PART OF YOUR SMALL BOWEL IS NORMAL. YOUR LOOSE STOOLS ARE MOST LIKELY DUE TO PROCTOCOLITIS.    TAKE PREDNISONE AND TAPER DOSE OVER ONE MONTH. TAKE WITH FOOD OR MILK. IT MAY CAUSE ABDOMINAL PAIN OR AGITATION.  IF YOU HAVE UPPER ABDOMINAL PAIN, YOU SHOULD USE PRILOSEC OTC DAILY.  YOUR BIOPSY RESULTS SHOULD BE BACK IN 14 DAYS.  FOLLOW UP IN NOV 2015    Colonoscopy Care After Read the instructions outlined below and refer to this sheet in the next week. These discharge instructions provide you with general information on caring for yourself after you leave the hospital. While your treatment has been planned according to the most current medical practices available, unavoidable complications occasionally occur. If you have any problems or questions after discharge, call DR. Ivy Meriwether, [phone].  ACTIVITY  You may resume your regular activity, but move at a slower pace for the next 24 hours.   Take frequent rest periods for the next 24 hours.   Walking will help get rid of the air and reduce the bloated feeling in your belly (abdomen).   No driving for 24 hours (because of the medicine (anesthesia) used during the test).   You may shower.   Do not sign any important legal documents or operate any machinery for 24 hours (because of the anesthesia used during the test).    NUTRITION  Drink plenty of fluids.   You may resume your normal diet as instructed by your doctor.   Begin with a light meal and progress to your normal diet. Heavy or fried foods are harder to digest and may make you feel sick to your stomach (nauseated).   Avoid alcoholic beverages for 24 hours or as instructed.    MEDICATIONS  You may resume your normal medications.   WHAT YOU CAN EXPECT TODAY  Some feelings of bloating in the abdomen.   Passage of more gas than usual.   Spotting of blood in your stool or on the toilet paper  .  IF YOU HAD POLYPS REMOVED DURING THE COLONOSCOPY:  Eat a  soft diet IF YOU HAVE NAUSEA, BLOATING, ABDOMINAL PAIN, OR VOMITING.    FINDING OUT THE RESULTS OF YOUR TEST Not all test results are available during your visit. DR. Darrick Penna WILL CALL YOU WITHIN 7 DAYS OF YOUR PROCEDUE WITH YOUR RESULTS. Do not assume everything is normal if you have not heard from DR. Webb Weed IN ONE WEEK, CALL HER OFFICE AT [phone].  SEEK IMMEDIATE MEDICAL ATTENTION AND CALL THE OFFICE: [phone] IF:  You have more than a spotting of blood in your stool.   Your belly is swollen (abdominal distention).   You are nauseated or vomiting.   You have a temperature over 101F.   You have abdominal pain or discomfort that is severe or gets worse throughout the day.

## 2013-09-05 NOTE — Op Note (Signed)
[facility] [address] [place], [address]   COLONOSCOPY PROCEDURE REPORT  PATIENT: Antonio Gallegos, Antonio Gallegos  MR#: [id number] BIRTHDATE: 07-20-1980 , 33  yrs. old GENDER: Male ENDOSCOPIST: Jonette Eva, MD REFERRED GB:TDVVO Gerda Diss, M.D. PROCEDURE DATE:  09/05/2013 PROCEDURE:   Colonoscopy with biopsy INDICATIONS:unexplained diarrhea. MEDICATIONS: Demerol 75 mg IV, Versed 5 mg IV, and Promethazine (Phenergan) 12.5mg  IV  DESCRIPTION OF PROCEDURE:    Physical exam was performed.  Informed consent was obtained from the patient after explaining the benefits, risks, and alternatives to procedure.  The patient was connected to monitor and placed in left lateral position. Continuous oxygen was provided by nasal cannula and IV medicine administered through an indwelling cannula.  After administration of sedation and rectal exam, the patients rectum was intubated and the EC-3890Li ([id number])  colonoscope was advanced under direct visualization to the ileum.  The scope was removed slowly by carefully examining the color, texture, anatomy, and integrity mucosa on the way out.  The patient was recovered in endoscopy and discharged home in satisfactory condition.     COLON FINDINGS: The mucosa appeared normal in the terminal ileum. Multiple biopsies were performed. INFLAMMATION BEGINNING IN RECTUM AND EXTENDING TO THE CECUM WITHOUT MUCOSAL SPARING.  COLD BIOPSIES OBTAINED IN COLON AND RECTUM.  PREP QUALITY: good.  CECAL W/D TIME: 13 minutes  COMPLICATIONS: None  ENDOSCOPIC IMPRESSION: 1.   Normal mucosa in the terminal ileum; multiple biopsies were performed 2.   PROCTOCOLITIS DUE TO [facility]' COLITIS  RECOMMENDATIONS: TAKE PREDNISONE AND TAPER DOSE OVER ONE MONTH.  TAKE WITH FOOD OR MILK.  IT MAY CAUSE ABDOMINAL PAIN OR AGITATION. IF DEVELOPS UPPER ABDOMINAL PAIN, USE PRILOSEC OTC DAILY. BIOPSY RESULTS SHOULD BE BACK IN 14 DAYS.  FOLLOW UP IN NOV  2015       _______________________________ eSignedJonette Eva, MD 09/05/2013 4:31 PM

## 2013-09-05 NOTE — Progress Notes (Signed)
REVIEWED-NO ADDITIONAL RECOMMENDATIONS. 

## 2013-09-07 ENCOUNTER — Encounter: Payer: Self-pay | Admitting: Family Medicine

## 2013-09-07 DIAGNOSIS — K5090 Crohn's disease, unspecified, without complications: Secondary | ICD-10-CM | POA: Insufficient documentation

## 2013-09-07 NOTE — Progress Notes (Signed)
Cc to pcp °

## 2013-09-08 ENCOUNTER — Telehealth: Payer: Self-pay

## 2013-09-08 MED ORDER — BUDESONIDE 3 MG PO CP24: 9.0000 mg | ORAL_CAPSULE | Freq: Every day | ORAL | Status: DC

## 2013-09-08 NOTE — Telephone Encounter (Signed)
Pt called and said he had colonoscopy on Mon and was given some prednisone for colitis. He said he has not been able to sleep for the last couple of nights and he is sure it is coming from the Prednisone. His mom has had the same problem with prednisone. He said he was going to take it today, but if he continues to not be able to sleep he just cannot take it. Please advise!

## 2013-09-08 NOTE — Telephone Encounter (Signed)
Routing to Tana Coast, PA since Dr. Darrick Penna has gone for the day.

## 2013-09-08 NOTE — Telephone Encounter (Signed)
RX done. I gave extra refills to make sure he was good until his OV next month.

## 2013-09-08 NOTE — Telephone Encounter (Signed)
Thanks! He was aware we would send today.

## 2013-09-08 NOTE — Telephone Encounter (Signed)
LMOM to call.

## 2013-09-08 NOTE — Telephone Encounter (Signed)
Pt returned call and was informed. OK to send the prescription to [facility].

## 2013-09-08 NOTE — Telephone Encounter (Signed)
PLEASE CALL PT. HIS BIOPSIES SHOW HE HAS CHRON'S DISEASE IN HIS SMALL AND LARGE INTESTINES. HE MAY STOP PREDNISONE AND TRY THE FOLLOWING. ENTOCORT 9 MG DAILY FOR 8 WEEKS AND

## 2013-09-09 NOTE — Telephone Encounter (Signed)
I have not heard back from Dr. Darrick Penna. I called her cell and asked her if she doesn't respond before we leave the office , could she please call the pt and I left his phone number with her.

## 2013-09-09 NOTE — Telephone Encounter (Signed)
West Bali, MD [facility], LPN            Call in for BrissonPentasa 1 gm qid. He should take Entocort as well      Rx called to [place] at [facility] for 30 days with one refill. Pt is aware to take Entocort as well.

## 2013-09-09 NOTE — Telephone Encounter (Signed)
T/C from Dr. Darrick Penna, she said pt will not need the Entocort. She will advise this AM what pt will need. I called pharmacy and pt had picked up Rx last evening. I called pt and LMOM for pt to call and to not take the Entocort, Dr. Darrick Penna will advise! Asked pt to call me before we leave at 12:00 noon today.

## 2013-09-09 NOTE — Telephone Encounter (Signed)
Pt returned my call. He has not started taking the Entocort and will await a call back this morning for further instructions.

## 2013-09-13 NOTE — Telephone Encounter (Signed)
REVIEWED. AGREE. NO ADDITIONAL RECOMMENDATIONS. 

## 2013-10-21 ENCOUNTER — Telehealth: Payer: Self-pay

## 2013-10-21 ENCOUNTER — Other Ambulatory Visit: Payer: Self-pay

## 2013-10-21 NOTE — Telephone Encounter (Signed)
Pt left a VM that for the last 3 nights he has been waking up in the middle of the night with bad abdominal cramps for about an hour. He is on Pentasa and Entocort and said that he read the side effects and thinks it might be Pentasa. He has F/U appt on 11/01/2013. He would like to know if he should hold the Pentasa or please advise. I called back and left him a VM that I got his message and Dr. Darrick Penna is not in, but I will send her a message at the hospital. He said if we call back, he is very busy today at work and we could leave a message. I left message that we leave at 12:00 noon today, and if he gets worse over the weekend he can go to the ED.

## 2013-10-24 NOTE — Telephone Encounter (Signed)
Pt left VM that he stopped the Pentasa yesterday and his abdominal cramps had improved somewhat last night. He would like to know if Dr. Darrick Penna approves of that or has other recommendations. Please advise!

## 2013-10-27 NOTE — Telephone Encounter (Signed)
Called patient TO DISCUSS CONCERNS. LVM-CALL [phone] TO DISCUSS.  

## 2013-11-01 ENCOUNTER — Encounter: Payer: Self-pay | Admitting: Gastroenterology

## 2013-11-01 ENCOUNTER — Ambulatory Visit (INDEPENDENT_AMBULATORY_CARE_PROVIDER_SITE_OTHER): Payer: BC Managed Care – PPO | Admitting: Gastroenterology

## 2013-11-01 VITALS — BP 139/71 | HR 56 | Resp 20 | Ht 68.0 in | Wt 184.0 lb

## 2013-11-01 DIAGNOSIS — K5090 Crohn's disease, unspecified, without complications: Secondary | ICD-10-CM

## 2013-11-01 LAB — CBC
HCT: 38.6 % — ABNORMAL LOW (ref 39.0–52.0)
Hemoglobin: 13.1 g/dL (ref 13.0–17.0)
MCH: 29.4 pg (ref 26.0–34.0)
MCHC: 33.9 g/dL (ref 30.0–36.0)
MCV: 86.5 fL (ref 78.0–100.0)
PLATELETS: 241 10*3/uL (ref 150–400)
RBC: 4.46 MIL/uL (ref 4.22–5.81)
RDW: 13.4 % (ref 11.5–15.5)
WBC: 5.3 10*3/uL (ref 4.0–10.5)

## 2013-11-01 NOTE — Telephone Encounter (Signed)
Pt was seen by Gerrit HallsAnna Sams, NP in the office today.

## 2013-11-01 NOTE — Progress Notes (Signed)
      Primary Care Physician:  Lilyan Punt, MD Primary GI: Dr. Darrick Penna   Chief Complaint  Patient presents with  . Colonoscopy    HPI:   Antonio Gallegos presents today in follow-up with newly diagnosed ileocolonic Crohn's disease. Started on entocort and Pentasa. Occasional stomach cramps and discomfort. On entocort. Symptoms cleared up significantly within a few days ago. About a month into it had a string of 5 days with stomach cramps and discomfort. Read the insert on Pentasa so stopped Pentasa. Discomfort subsided a bit. Also woke up with night sweats after a conditioning work-out. Now restarted Pentasa at a lower dosage, 2 capsules TID instead of QID. Now taking it "sort've". Overall doing well with Pentasa TID.   Past Medical History  Diagnosis Date  . Proctocolitis without complication AUG 2015    Past Surgical History  Procedure Laterality Date  . Anterior cruciate ligament repair Left 2004    [facility]  . Colonoscopy N/A 09/05/2013    Dr. Fields:Normal mucosa in the terminal ileum; multiple biopsies/PROCTOCOLITIS DUE to Crohn's disease. Ileal biopsy consistent with Crohn's disease     Current Outpatient Prescriptions  Medication Sig Dispense Refill  . budesonide (ENTOCORT EC) 3 MG 24 hr capsule Take 3 capsules (9 mg total) by mouth daily.  90 capsule  3  . mesalamine (PENTASA) 500 MG CR capsule Take 1,000 mg by mouth 3 (three) times daily.       No current facility-administered medications for this visit.    Allergies as of 11/01/2013 - Review Complete 11/01/2013  Allergen Reaction Noted  . Other  08/16/2012    Family History  Problem Relation Age of Onset  . Colon cancer Neg Hx   . Celiac disease Neg Hx   . Lymphoma Mother     History   Social History  . Marital Status: Single    Spouse Name: N/A    Number of Children: N/A  . 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  Smoking status: Former Games developer  . Smokeless tobacco: None     Comment: Quit x 10 years  . Alcohol Use: Yes     Comment: Ocassionally  . Drug Use: No  . Sexual Activity: None   Other Topics Concern  . None   Social History Narrative  . None    Review of Systems: As mentioned in HPI.  Physical Exam: BP 139/71  Pulse 56  Resp 20  Ht 5\' 8"  (1.727 m)  Wt 184 lb (83.462 kg)  BMI 27.98 kg/m2 General:   Alert and oriented. No distress noted. Pleasant and cooperative.  Head:  Normocephalic and atraumatic. Eyes:  Conjuctiva clear without scleral icterus. Abdomen:  +BS, soft, non-tender and non-distended. No rebound or guarding. No HSM or masses noted. Msk:  Symmetrical without gross deformities. Normal posture. Extremities:  Without edema. Neurologic:  Alert and  oriented x4;  grossly normal neurologically. Skin:  Intact without significant lesions or rashes. Psych:  Alert and cooperative. Normal mood and affect.

## 2013-11-01 NOTE — Patient Instructions (Signed)
Decrease budesonide to 6 mg daily for an additional 4 weeks, then decrease to 3 mg daily for 2 weeks then stop.  Continue Pentasa 2 capsules three times a day.  Please complete the blood work.  We will see you in 6 months!

## 2013-11-02 LAB — COMPREHENSIVE METABOLIC PANEL
ALK PHOS: 52 U/L (ref 39–117)
ALT: 14 U/L (ref 0–53)
AST: 19 U/L (ref 0–37)
Albumin: 4.5 g/dL (ref 3.5–5.2)
BUN: 25 mg/dL — ABNORMAL HIGH (ref 6–23)
CO2: 29 meq/L (ref 19–32)
CREATININE: 1.46 mg/dL — AB (ref 0.50–1.35)
Calcium: 9.4 mg/dL (ref 8.4–10.5)
Chloride: 103 mEq/L (ref 96–112)
Glucose, Bld: 95 mg/dL (ref 70–99)
Potassium: 4.4 mEq/L (ref 3.5–5.3)
SODIUM: 139 meq/L (ref 135–145)
TOTAL PROTEIN: 7.5 g/dL (ref 6.0–8.3)
Total Bilirubin: 0.4 mg/dL (ref 0.2–1.2)

## 2013-11-03 ENCOUNTER — Other Ambulatory Visit: Payer: Self-pay

## 2013-11-03 ENCOUNTER — Encounter: Payer: Self-pay | Admitting: Gastroenterology

## 2013-11-03 DIAGNOSIS — R7989 Other specified abnormal findings of blood chemistry: Secondary | ICD-10-CM

## 2013-11-03 DIAGNOSIS — R799 Abnormal finding of blood chemistry, unspecified: Secondary | ICD-10-CM

## 2013-11-03 NOTE — Assessment & Plan Note (Signed)
Ileocolonic involvement. Started on entocort 9/4 and Pentasa. Overall much improved symptoms; however, noted abdominal cramping with Pentasa so electively decreased dosage with improvement. Seems to do well with Pentasa 2 capsules TID. Will start to slowly wean entocort and remain on Pentasa. Check CBC, CMP today. Further recommendations to follow.

## 2013-11-03 NOTE — Progress Notes (Signed)
Quick Note:  LMOM for pt that he needs to repeat the BMP in one week and I am mailing the order to him. Call if he has questions. ______

## 2013-11-03 NOTE — Progress Notes (Signed)
cc'ed to pcp °

## 2013-11-03 NOTE — Progress Notes (Signed)
Quick Note:  Bump in BUN/Cr. Cr 1.46 on Pentasa. I have asked him to stop Pentasa. Spoke with him personally. Continue Entocort for now. I will need to discuss with Dr. Darrick Penna further options.   Please have him repeat BMP in 1 week. ______

## 2013-11-11 ENCOUNTER — Encounter: Payer: Self-pay | Admitting: Gastroenterology

## 2013-11-11 LAB — BASIC METABOLIC PANEL
BUN: 28 mg/dL — ABNORMAL HIGH (ref 6–23)
CALCIUM: 9.6 mg/dL (ref 8.4–10.5)
CO2: 28 meq/L (ref 19–32)
Chloride: 102 mEq/L (ref 96–112)
Creat: 1.56 mg/dL — ABNORMAL HIGH (ref 0.50–1.35)
Glucose, Bld: 77 mg/dL (ref 70–99)
POTASSIUM: 3.9 meq/L (ref 3.5–5.3)
SODIUM: 138 meq/L (ref 135–145)

## 2013-11-11 NOTE — Progress Notes (Signed)
REVIEWED-ADDITIONAL RECOMMENDATIONS-REFER TO NEPHROLOGY. CHECK TPMT GENETIC. PT WILL LIKELY NEED IMURAN TO CONTROL DISEASE IF HE CAN'T TOLERATE MESALAMINE PRODUCTS..Marland Kitchen

## 2013-11-11 NOTE — Telephone Encounter (Signed)
REVIEWED-NO ADDITIONAL RECOMMENDATIONS. 

## 2013-11-11 NOTE — Progress Notes (Signed)
Quick Note:  Unclear baseline renal function. However, even off mesalamine product, his Cr is persistent and actually slightly increased from 1 week ago. Discussed with Dr. Darrick Penna.   1. Need to refer to nephrology 2. Needs the TPMT testing in preparation for Imuran likely 3. Stay on Entocort for now I am sending a mychart message just with an update, but please have him complete the labs and have him referred to nephrology as soon as possible. ______

## 2013-11-14 NOTE — Progress Notes (Signed)
Quick Note:  Lab paper on Antonio Gallegos's desk to sign and check appropriate request. ______

## 2013-11-15 NOTE — Progress Notes (Signed)
Quick Note:  Patient would like to hold off on testing for now. He is curious about staying on Pentasa, as it appears he MAY have had chronic mildly elevated creatinine from talking to him. Hold off on orders for now; will need to discuss further with Dr. Darrick Penna ______

## 2013-11-15 NOTE — Progress Notes (Signed)
Quick Note:    Noted    ______

## 2013-11-16 ENCOUNTER — Other Ambulatory Visit: Payer: Self-pay

## 2013-11-16 DIAGNOSIS — R7989 Other specified abnormal findings of blood chemistry: Secondary | ICD-10-CM

## 2013-11-18 ENCOUNTER — Encounter: Payer: Self-pay | Admitting: Family Medicine

## 2013-11-18 ENCOUNTER — Ambulatory Visit (INDEPENDENT_AMBULATORY_CARE_PROVIDER_SITE_OTHER): Payer: BC Managed Care – PPO | Admitting: Family Medicine

## 2013-11-18 VITALS — BP 112/72 | Temp 97.7°F | Ht 68.0 in | Wt 183.0 lb

## 2013-11-18 DIAGNOSIS — K50919 Crohn's disease, unspecified, with unspecified complications: Secondary | ICD-10-CM

## 2013-11-18 DIAGNOSIS — N289 Disorder of kidney and ureter, unspecified: Secondary | ICD-10-CM

## 2013-11-18 NOTE — Progress Notes (Signed)
   Subjective:    Patient ID: Antonio Gallegos, male    DOB: 02/12/1980, 33 y.o.   MRN: [id number]  HPI Patient is here today because he was recently diagnosed with Crohn's disease and he has been having night sweats. Patient states that he was also told by his specialist that his creatinine was slightly high. He states that he also wants to get [facility] to get a second opinion on his condition and treatment options.   This patient states his creatinine has been elevated couple different times through the gastroenterologist office. He works out at least 4-5 times per week he takes protein shakes. He estimates his protein intake to be 150 g per day. He has never had kidney problems in the past to his knowledge nor does he have a family history. Review of Systems Denies fever chills sweats hematuria.    Objective:   Physical Exam  Lungs are clear hearts regular abdomen soft no guarding rebound extremities no edema neurologic grossly normal      Assessment & Plan:  #1 Crohn's-[facility]. Patient would like second opinion. I do recommend sending surgical pathology report along with recent labs. Also to send a copy of the colonoscopy report  #2 renal insufficiency-24 hour urine protein 24 hour urine creatinine. Apparently referral was made from the GI office but he has not heard anything. We will have our referral specialist make sure that the patient was referred to nephrology.he will also have additional testing completed. Patient will need ANA along with sedimentation rate.patient will also need to have creatinine kinase level. In addition to this he needs renal ultrasound. He was advised to reduce protein to 80 g or less

## 2013-11-29 ENCOUNTER — Telehealth: Payer: Self-pay | Admitting: Family Medicine

## 2013-11-29 NOTE — Telephone Encounter (Signed)
Patient was referred to Dr. Kristian Covey by Dr. Darrick Penna, their office processed referral, appointment with Dr. Kristian Covey is 01/30/2014 @ 11:00  You placed a referral for the same specialty for [facility], not sure what to do since patient has already been referred, please advise

## 2013-11-29 NOTE — Telephone Encounter (Signed)
Pt came to me wanting referral as well as me to manage it. I rec renal-nephrology- [place] or [facility], thanks. You can talk with pt and confirm what he wants.

## 2013-11-30 ENCOUNTER — Encounter: Payer: Self-pay | Admitting: Gastroenterology

## 2013-11-30 NOTE — Progress Notes (Signed)
Quick Note:  When was appt? Can we get records?   ______

## 2013-12-06 NOTE — Progress Notes (Signed)
Quick Note:  Per Ginger, she had made the referral. She is looking in to this. ______

## 2013-12-06 NOTE — Progress Notes (Signed)
Quick Note:  Appointment has not been completed, and patient states he does not have an appt with nephrology. This was made on 11/11. We need to find out the status of the appt. He needs to be seen there ASAP. ______

## 2013-12-07 LAB — CREATININE CLEARANCE, URINE, 24 HOUR
CREAT CLEAR: 150 mL/min — AB (ref 75–125)
CREATININE: 1.34 mg/dL (ref 0.50–1.35)
Creatinine, 24H Ur: 2885 mg/d — ABNORMAL HIGH (ref 800–2000)
Creatinine, Urine: 93.1 mg/dL

## 2013-12-07 LAB — PROTEIN, URINE, 24 HOUR
PROTEIN, URINE: 4 mg/dL — AB (ref 5–25)
Protein, 24H Urine: 124 mg/d (ref <150)

## 2013-12-08 NOTE — Progress Notes (Addendum)
Doubt Pentasa is contributing to his Cr. He should discuss with Nephrology. PT REQUESTED DR. Gerda DissLUKING REFER HIM TO NEPHROLOGY APPT IN [place] OR [place]. PT stopped Pentasa because he thought it was making his GI symptoms worse. THINKS HE CAN TOLERATE PENTASA 2 PO TID. Furthermore, Pentasa/ENTOCORT will not ADEQUATELY TREAT ileocolonic Crohn's disease. Pt requested referral to [facility] for a 2nd opinion. He should discuss future treatment options with [facility].

## 2013-12-08 NOTE — Progress Notes (Signed)
Quick Note:  Thanks for all the help. Looks like nephrology appt wasn't till late Jan. He has been referred to nephrology in [place] I believe. ______

## 2013-12-12 ENCOUNTER — Encounter: Payer: Self-pay | Admitting: Family Medicine

## 2013-12-13 NOTE — Telephone Encounter (Signed)
Please let the patient know that I read his message and I forwarded this to you to help let him know what is going on. Please let the patient know that we did in fact put in the referrals for both of these back on the 13th and November. Unfortunately we are at the mercy of these individual groups at granting appointments. Most referral places are rather slow at best at appointing Tobi Bastos an appointment. Please see what we can do to get these appointment time set up so this patient can be aware of their appointments. It does seem like a long span of time. Please be in contact with the patient. I don't know if it would help if he calls as well. Thank you for letting him know about updates.

## 2013-12-14 ENCOUNTER — Other Ambulatory Visit: Payer: Self-pay | Admitting: Gastroenterology

## 2013-12-14 MED ORDER — BUDESONIDE 3 MG PO CP24: 9.0000 mg | ORAL_CAPSULE | Freq: Every day | ORAL | Status: DC

## 2013-12-14 NOTE — Progress Notes (Signed)
Patient notified and verbalized understanding of the test results. I then transferred him to [place] about the referral.

## 2013-12-18 ENCOUNTER — Telehealth: Payer: Self-pay | Admitting: Gastroenterology

## 2013-12-18 NOTE — Telephone Encounter (Signed)
Can you send patient a message via Mychart with the information for Dr. Kristian CoveyBefekadu in [place]? He still has an appt there in Jan, but he would like the phone number. Thanks!

## 2013-12-19 NOTE — Telephone Encounter (Signed)
Sent message in MyChart

## 2013-12-19 NOTE — Telephone Encounter (Signed)
Done

## 2013-12-19 NOTE — Telephone Encounter (Signed)
Thanks

## 2013-12-23 ENCOUNTER — Telehealth: Payer: Self-pay | Admitting: Family Medicine

## 2013-12-23 NOTE — Telephone Encounter (Signed)
Fax received from [facility], they are requesting records from Dr. Jeanella Flatteryourks office & pathology from colonoscopy before they can schedule, LMOVM for patient explaining & gave fax number for records to be sent to  Fx# [phone] Attn: Kathalene FramesSharon Cifelli ph# [phone]

## 2014-01-09 ENCOUNTER — Telehealth: Payer: Self-pay | Admitting: Gastroenterology

## 2014-01-09 NOTE — Telephone Encounter (Signed)
Patient sent email requesting refills on Entocort. Should be refills available. Staff to contact pharmacy. Discussed with Ginger Hardie Pulley.

## 2014-01-09 NOTE — Telephone Encounter (Signed)
Pt does have refills. He just picked up one today. He also has 3 more refills on it.

## 2014-01-18 NOTE — Telephone Encounter (Signed)
Please let the patient know that I did receive the consultation from [facility]. I do believe it is in his best interest to follow-up with gastroenterology. Please confirm that he will set up an appointment to go back and see [facility] if he should choose someone different please let me know otherwise it would be wise to fax a copy of the consultation to the GI doc.

## 2014-01-18 NOTE — Telephone Encounter (Signed)
Received [facility] consult, see consult

## 2014-01-20 NOTE — Telephone Encounter (Signed)
LMOVM for pt, explained Dr. Lorin Picket received GI consult note from [facility] and he recommends follow up with [facility]

## 2014-01-24 ENCOUNTER — Encounter: Payer: Self-pay | Admitting: Gastroenterology

## 2014-01-25 ENCOUNTER — Telehealth: Payer: Self-pay | Admitting: Gastroenterology

## 2014-01-25 NOTE — Telephone Encounter (Signed)
Pt called this afternoon asking to speak with AS. I told him that she was not available at the moment and I could take a message. He did not want to leave me a message and said that he would talk to her via MyChart.

## 2014-01-30 ENCOUNTER — Telehealth: Payer: Self-pay | Admitting: Family Medicine

## 2014-01-30 NOTE — Telephone Encounter (Signed)
Patient was seen at [facility] for his Crohn's disease a recommended a course of action. He will be following up with local gastroenterology.

## 2014-01-31 ENCOUNTER — Other Ambulatory Visit (HOSPITAL_COMMUNITY): Payer: Self-pay | Admitting: Nephrology

## 2014-01-31 DIAGNOSIS — N183 Chronic kidney disease, stage 3 unspecified: Secondary | ICD-10-CM

## 2014-02-03 ENCOUNTER — Other Ambulatory Visit: Payer: Self-pay | Admitting: Gastroenterology

## 2014-02-03 MED ORDER — MESALAMINE ER 500 MG PO CPCR: 1000.0000 mg | ORAL_CAPSULE | Freq: Three times a day (TID) | ORAL | Status: DC

## 2014-02-14 ENCOUNTER — Ambulatory Visit (HOSPITAL_COMMUNITY)
Admission: RE | Admit: 2014-02-14 | Discharge: 2014-02-14 | Disposition: A | Discharge status: Home / Self Care | Payer: BLUE CROSS/BLUE SHIELD | Source: Ambulatory Visit | Attending: Nephrology | Admitting: Nephrology

## 2014-02-14 DIAGNOSIS — N183 Chronic kidney disease, stage 3 unspecified: Secondary | ICD-10-CM

## 2014-03-28 ENCOUNTER — Encounter: Payer: Self-pay | Admitting: Gastroenterology

## 2014-07-28 ENCOUNTER — Encounter: Payer: Self-pay | Admitting: Gastroenterology

## 2014-08-06 NOTE — Progress Notes (Signed)
REVIEWED-NO ADDITIONAL RECOMMENDATIONS. 

## 2015-01-28 ENCOUNTER — Other Ambulatory Visit: Payer: Self-pay | Admitting: Gastroenterology

## 2015-02-17 NOTE — Progress Notes (Signed)
REVIEWED-NO ADDITIONAL RECOMMENDATIONS. 

## 2015-03-13 ENCOUNTER — Encounter: Payer: Self-pay | Admitting: Family Medicine

## 2015-03-13 ENCOUNTER — Ambulatory Visit (INDEPENDENT_AMBULATORY_CARE_PROVIDER_SITE_OTHER): Payer: BLUE CROSS/BLUE SHIELD | Admitting: Family Medicine

## 2015-03-13 VITALS — BP 120/80 | Temp 98.5°F | Ht 68.0 in | Wt 182.0 lb

## 2015-03-13 DIAGNOSIS — J310 Chronic rhinitis: Secondary | ICD-10-CM

## 2015-03-13 DIAGNOSIS — J329 Chronic sinusitis, unspecified: Secondary | ICD-10-CM

## 2015-03-13 MED ORDER — AMOXICILLIN 500 MG PO CAPS
500.0000 mg | ORAL_CAPSULE | Freq: Three times a day (TID) | ORAL | Status: DC
Start: 2015-03-13 — End: 2015-03-23

## 2015-03-13 NOTE — Progress Notes (Signed)
   Subjective:    Patient ID: Antonio Gallegos, male    DOB: [birth year], 35 y.o.   MRN: [id number]  URI  This is a new problem. The current episode started in the past 7 days. The problem has been unchanged. There has been no fever. Associated symptoms include congestion and coughing. Associated symptoms comments: fatigue. He has tried nothing for the symptoms. The treatment provided no relief.  was outside a lot, and got exposed to the cold  Then developed tickle and a coroupy coug  Felt bad and diminshed energy gets uti the chest  Some drainage   Non profd Patient is also having right eye discomfort.  Right eye discomfort this past wk felt irritated and disch  Review of Systems  HENT: Positive for congestion.   Respiratory: Positive for cough.        Objective:   Physical Exam  Alert, mild malaise. Hydration good Vitals stable. frontal/ maxillary tenderness evident positive nasal congestion. pharynx normal neck supple  lungs clear/no crackles or wheezes. heart regular in rhythm       Assessment & Plan:  Impression rhinosinusitis likely post viral, discussed with patient. plan antibiotics prescribed. Questions answered. Symptomatic care discussed. warning signs discussed. WSL

## 2015-03-23 ENCOUNTER — Telehealth: Payer: Self-pay | Admitting: Family Medicine

## 2015-03-23 MED ORDER — LEVOFLOXACIN 500 MG PO TABS: 500.0000 mg | ORAL_TABLET | Freq: Every day | ORAL | Status: DC

## 2015-03-23 NOTE — Telephone Encounter (Signed)
Rx sent electronically to pharmacy. Patient notified. 

## 2015-03-23 NOTE — Telephone Encounter (Signed)
Pt was seen last Tuesday and was diagnosed with Bronchitis. Pt states that he is still coughing and running fevers on and off. Pt also has low energy. Pt wants to know if he needs to be rechecked.     [facility]

## 2015-03-23 NOTE — Telephone Encounter (Signed)
Stronger abx lev 500 qd for ten d, f u next wk with [facility] if persists

## 2015-03-23 NOTE — Telephone Encounter (Signed)
Patient was given Amoxil - 03/13/15

## 2015-03-28 ENCOUNTER — Encounter: Payer: Self-pay | Admitting: Family Medicine

## 2015-03-28 ENCOUNTER — Ambulatory Visit (INDEPENDENT_AMBULATORY_CARE_PROVIDER_SITE_OTHER): Payer: BLUE CROSS/BLUE SHIELD | Admitting: Family Medicine

## 2015-03-28 VITALS — Temp 98.1°F | Ht 68.0 in | Wt 182.0 lb

## 2015-03-28 DIAGNOSIS — J329 Chronic sinusitis, unspecified: Secondary | ICD-10-CM | POA: Diagnosis not present

## 2015-03-28 DIAGNOSIS — J310 Chronic rhinitis: Secondary | ICD-10-CM

## 2015-03-28 DIAGNOSIS — J209 Acute bronchitis, unspecified: Secondary | ICD-10-CM | POA: Diagnosis not present

## 2015-03-28 MED ORDER — AZITHROMYCIN 250 MG PO TABS: ORAL_TABLET | ORAL | Status: DC

## 2015-03-28 NOTE — Progress Notes (Signed)
   Subjective:    Patient ID: Antonio Gallegos, male    DOB: 12-Apr-1980, 35 y.o.   MRN: [id number]  Cough This is a new problem. The current episode started 1 to 4 weeks ago. Associated symptoms include a fever, nasal congestion and a sore throat. Treatments tried: amoxil.  Please see previous note from when the patient was here Took amoxicillin without improvement Allowed to go on for several more days ongoing congestion coughing denies wheezing difficulty breathing does relate some low-grade fevers no vomiting but having some diarrhea but not mucousy or bloody.    Review of Systems  Constitutional: Positive for fever.  HENT: Positive for sore throat.   Respiratory: Positive for cough.        Objective:   Physical Exam  Constitutional: He appears well-developed.  HENT:  Head: Normocephalic.  Mouth/Throat: Oropharynx is clear and moist. No oropharyngeal exudate.  Neck: Normal range of motion.  Cardiovascular: Normal rate, regular rhythm and normal heart sounds.   No murmur heard. Pulmonary/Chest: Effort normal and breath sounds normal. He has no wheezes.  Lymphadenopathy:    He has no cervical adenopathy.  Neurological: He exhibits normal muscle tone.  Skin: Skin is warm and dry.  Nursing note and vitals reviewed.     Azithromycin recommended should cover for mycoplasma    Assessment & Plan:  Patient with significant bronchitis present for several weeks tried amoxicillin initially then has let it go on to see if it will get better on its own productive cough denies wheezing or shortness of breath no nausea vomiting diarrhea no sinus pressure pain patient with moderate amount of coughing relates low-grade fevers.  Fatigue and tiredness related to the above sickness should get better after sickness gets better if not doing significantly better within the next 7-10 days notify us and we will do x-rays and lab work.  If diarrhea persists despite using probiotics then I would  recommend doing stool testing for C. difficile

## 2015-03-28 NOTE — Patient Instructions (Addendum)
[facility]- one daily  If not doing better by April 3 we need to know ( may need to do labs and xrays)

## 2015-04-05 ENCOUNTER — Telehealth: Payer: Self-pay | Admitting: Family Medicine

## 2015-04-05 MED ORDER — CEPHALEXIN 500 MG PO CAPS: 500.0000 mg | ORAL_CAPSULE | Freq: Three times a day (TID) | ORAL | Status: DC

## 2015-04-05 NOTE — Telephone Encounter (Signed)
Med sent electronically to pharmacy. Patient notified. 

## 2015-04-05 NOTE — Telephone Encounter (Signed)
Keflex 500 tid ten d 

## 2015-04-05 NOTE — Telephone Encounter (Signed)
Pt was seen a couple days ago and was given a zpac. Pt states that they are causing abd pains and is needing something else called in.     [facility]

## 2015-05-01 ENCOUNTER — Ambulatory Visit (INDEPENDENT_AMBULATORY_CARE_PROVIDER_SITE_OTHER): Payer: BLUE CROSS/BLUE SHIELD | Admitting: Family Medicine

## 2015-05-01 ENCOUNTER — Encounter: Payer: Self-pay | Admitting: Family Medicine

## 2015-05-01 ENCOUNTER — Ambulatory Visit (HOSPITAL_COMMUNITY)
Admission: RE | Admit: 2015-05-01 | Discharge: 2015-05-01 | Disposition: A | Discharge status: Home / Self Care | Payer: BLUE CROSS/BLUE SHIELD | Source: Ambulatory Visit | Attending: Family Medicine | Admitting: Family Medicine

## 2015-05-01 VITALS — BP 128/76 | Temp 98.3°F | Ht 68.0 in | Wt 164.0 lb

## 2015-05-01 DIAGNOSIS — R05 Cough: Secondary | ICD-10-CM | POA: Diagnosis not present

## 2015-05-01 DIAGNOSIS — R059 Cough, unspecified: Secondary | ICD-10-CM

## 2015-05-01 DIAGNOSIS — R634 Abnormal weight loss: Secondary | ICD-10-CM | POA: Diagnosis not present

## 2015-05-01 DIAGNOSIS — R197 Diarrhea, unspecified: Secondary | ICD-10-CM | POA: Diagnosis not present

## 2015-05-01 NOTE — Progress Notes (Signed)
   Subjective:    Patient ID: Antonio Gallegos, male    DOB: 06-01-1980, 35 y.o.   MRN: [id number]  Cough This is a recurrent problem. Episode onset: 8 weeks ago. Associated symptoms comments: Fatigue . Treatments tried: 3 rounds of antibiotics.    Weight loss is noted He has had occasional cough over the past several days but he states it is better compared where it was past few weeks Frequent diarrhea not mucousy not bloody. Follows with specialist they [facility] and they are planning on doing a colonoscopy possibly of his stool studies and lab work does not show the results to cause this  Review of Systems  Respiratory: Positive for cough.    Denies bloody stools relates diarrhea denies vomiting relates weight loss denies sinus pressure pain or discomfort    Objective:   Physical Exam Looks well Neck no masses lungs clear no crackles heart regular abdomen soft no guarding rebound       Assessment & Plan:  Weight loss malabsorption possible related to colitis possibly related to celiac disease specialist is working this up  Cough is improving but still has minimal cough given his weight loss we will do a chest x-ray no further antibiotics necessary

## 2015-05-02 ENCOUNTER — Telehealth: Payer: Self-pay | Admitting: Family Medicine

## 2015-05-02 NOTE — Telephone Encounter (Signed)
I did call the patient regarding his results I got his voicemail. I sent a message via my chart that his chest x-ray was normal but the patient may call back if he does please let him know chest x-ray was normal and follow-up if any ongoing troubles-thanks-he may not call back if he reads my chart and is satisfied

## 2015-06-20 DIAGNOSIS — K5080 Crohn's disease of both small and large intestine without complications: Secondary | ICD-10-CM | POA: Diagnosis not present

## 2015-06-28 ENCOUNTER — Encounter: Payer: Self-pay | Admitting: Family Medicine

## 2015-06-28 DIAGNOSIS — E559 Vitamin D deficiency, unspecified: Secondary | ICD-10-CM | POA: Diagnosis not present

## 2015-06-28 DIAGNOSIS — Z79899 Other long term (current) drug therapy: Secondary | ICD-10-CM | POA: Diagnosis not present

## 2015-06-28 DIAGNOSIS — R809 Proteinuria, unspecified: Secondary | ICD-10-CM | POA: Diagnosis not present

## 2015-06-28 DIAGNOSIS — I10 Essential (primary) hypertension: Secondary | ICD-10-CM | POA: Diagnosis not present

## 2015-06-28 DIAGNOSIS — N183 Chronic kidney disease, stage 3 (moderate): Secondary | ICD-10-CM | POA: Diagnosis not present

## 2015-06-28 DIAGNOSIS — D509 Iron deficiency anemia, unspecified: Secondary | ICD-10-CM | POA: Diagnosis not present

## 2015-07-03 DIAGNOSIS — N183 Chronic kidney disease, stage 3 (moderate): Secondary | ICD-10-CM | POA: Diagnosis not present

## 2015-07-03 DIAGNOSIS — K5090 Crohn's disease, unspecified, without complications: Secondary | ICD-10-CM | POA: Diagnosis not present

## 2015-07-03 DIAGNOSIS — D649 Anemia, unspecified: Secondary | ICD-10-CM | POA: Diagnosis not present

## 2015-07-09 ENCOUNTER — Ambulatory Visit (INDEPENDENT_AMBULATORY_CARE_PROVIDER_SITE_OTHER): Payer: BLUE CROSS/BLUE SHIELD | Admitting: Family Medicine

## 2015-07-09 ENCOUNTER — Encounter: Payer: Self-pay | Admitting: Family Medicine

## 2015-07-09 VITALS — BP 118/76 | Temp 97.5°F | Ht 68.0 in | Wt 170.0 lb

## 2015-07-09 DIAGNOSIS — H5789 Other specified disorders of eye and adnexa: Secondary | ICD-10-CM

## 2015-07-09 DIAGNOSIS — K5010 Crohn's disease of large intestine without complications: Secondary | ICD-10-CM | POA: Diagnosis not present

## 2015-07-09 DIAGNOSIS — H578 Other specified disorders of eye and adnexa: Secondary | ICD-10-CM

## 2015-07-09 MED ORDER — OLOPATADINE HCL 0.2 % OP SOLN: 1.0000 [drp] | Freq: Every evening | OPHTHALMIC | Status: DC | PRN

## 2015-07-09 NOTE — Progress Notes (Signed)
   Subjective:    Patient ID: Antonio Gallegos, male    DOB: 08-Dec-1980, 35 y.o.   MRN: [id number]  Eye Pain  The right eye is affected. This is a new problem. The current episode started in the past 7 days. The injury mechanism is unknown. Associated symptoms include eye redness. Pertinent negatives include no eye discharge, fever or photophobia. Associated symptoms comments: Edema.   Patient states no other concerns this visit. He denies any drainage denies pain or discomfort just a little bit of soreness in the upper eyelid Patient with a history of Crohn's disease Review of Systems  Constitutional: Negative for fever and fatigue.  Eyes: Positive for redness. Negative for photophobia, pain, discharge, itching and visual disturbance.  Respiratory: Negative for cough and shortness of breath.   Gastrointestinal: Positive for diarrhea.       Objective:   Physical Exam Lungs clear heart regular right eye some redness of the sclera no iritis   Patient has been having some thinning of his hair he thinks his specialists has checked his thyroid he will find out he will notify us if we need to check his thyroid    Assessment & Plan:  Eye redness is noted patient denies any severe pain denies any loss of vision We will try allergy eyedrop daily for the next several days if worse We will refer to optometry if worse  15 minutes spent with patient discussing Crohn's discussing what the specialists is recommending and also iron infusion for iron deficient anemia that the kidney doctor is doing

## 2015-07-12 ENCOUNTER — Encounter (HOSPITAL_COMMUNITY)
Admission: RE | Admit: 2015-07-12 | Discharge: 2015-07-12 | Disposition: A | Discharge status: Home / Self Care | Payer: BLUE CROSS/BLUE SHIELD | Source: Ambulatory Visit | Attending: Nephrology | Admitting: Nephrology

## 2015-07-12 ENCOUNTER — Encounter (HOSPITAL_COMMUNITY): Payer: Self-pay

## 2015-07-12 DIAGNOSIS — D509 Iron deficiency anemia, unspecified: Secondary | ICD-10-CM | POA: Diagnosis not present

## 2015-07-12 MED ORDER — SODIUM CHLORIDE 0.9 % IV SOLN
510.0000 mg | INTRAVENOUS | Status: DC
  Administered 2015-07-12: 510 mg via INTRAVENOUS
  Filled 2015-07-12: qty 17

## 2015-07-12 MED ORDER — SODIUM CHLORIDE 0.9 % IV SOLN
INTRAVENOUS | Status: DC
Start: 2015-07-12 — End: 2015-07-13
  Administered 2015-07-12: 13:00:00 via INTRAVENOUS

## 2015-07-19 ENCOUNTER — Encounter (HOSPITAL_COMMUNITY)
Admission: RE | Admit: 2015-07-19 | Discharge: 2015-07-19 | Disposition: A | Discharge status: Home / Self Care | Payer: BLUE CROSS/BLUE SHIELD | Source: Ambulatory Visit | Attending: Nephrology | Admitting: Nephrology

## 2015-07-19 DIAGNOSIS — D509 Iron deficiency anemia, unspecified: Secondary | ICD-10-CM | POA: Diagnosis not present

## 2015-07-19 MED ORDER — FERUMOXYTOL INJECTION 510 MG/17 ML
510.0000 mg | INTRAVENOUS | Status: DC
  Administered 2015-07-19: 510 mg via INTRAVENOUS
  Filled 2015-07-19: qty 17

## 2015-07-19 MED ORDER — SODIUM CHLORIDE 0.9 % IV SOLN
INTRAVENOUS | Status: DC
Start: 2015-07-19 — End: 2015-07-20
  Administered 2015-07-19: 200 mL via INTRAVENOUS

## 2015-07-25 DIAGNOSIS — K5080 Crohn's disease of both small and large intestine without complications: Secondary | ICD-10-CM | POA: Diagnosis not present

## 2015-08-03 ENCOUNTER — Other Ambulatory Visit: Payer: Self-pay | Admitting: Gastroenterology

## 2015-08-07 ENCOUNTER — Encounter: Payer: Self-pay | Admitting: Family Medicine

## 2015-08-07 DIAGNOSIS — R5383 Other fatigue: Secondary | ICD-10-CM

## 2015-08-08 DIAGNOSIS — K5080 Crohn's disease of both small and large intestine without complications: Secondary | ICD-10-CM | POA: Diagnosis not present

## 2015-08-08 DIAGNOSIS — R5383 Other fatigue: Secondary | ICD-10-CM | POA: Diagnosis not present

## 2015-08-09 LAB — TSH: TSH: 2.24 u[IU]/mL (ref 0.450–4.500)

## 2015-09-03 DIAGNOSIS — K5080 Crohn's disease of both small and large intestine without complications: Secondary | ICD-10-CM | POA: Diagnosis not present

## 2015-09-03 DIAGNOSIS — Z79899 Other long term (current) drug therapy: Secondary | ICD-10-CM | POA: Diagnosis not present

## 2015-09-14 DIAGNOSIS — K5080 Crohn's disease of both small and large intestine without complications: Secondary | ICD-10-CM | POA: Diagnosis not present

## 2015-09-27 DIAGNOSIS — K5080 Crohn's disease of both small and large intestine without complications: Secondary | ICD-10-CM | POA: Diagnosis not present

## 2015-10-31 DIAGNOSIS — H52223 Regular astigmatism, bilateral: Secondary | ICD-10-CM | POA: Diagnosis not present

## 2015-10-31 DIAGNOSIS — H5213 Myopia, bilateral: Secondary | ICD-10-CM | POA: Diagnosis not present

## 2015-11-01 DIAGNOSIS — K5080 Crohn's disease of both small and large intestine without complications: Secondary | ICD-10-CM | POA: Diagnosis not present

## 2015-11-05 DIAGNOSIS — K5080 Crohn's disease of both small and large intestine without complications: Secondary | ICD-10-CM | POA: Diagnosis not present

## 2015-11-05 DIAGNOSIS — R748 Abnormal levels of other serum enzymes: Secondary | ICD-10-CM | POA: Diagnosis not present

## 2015-11-28 DIAGNOSIS — K5080 Crohn's disease of both small and large intestine without complications: Secondary | ICD-10-CM | POA: Diagnosis not present

## 2015-12-02 DIAGNOSIS — Z23 Encounter for immunization: Secondary | ICD-10-CM | POA: Diagnosis not present

## 2015-12-06 DIAGNOSIS — K5080 Crohn's disease of both small and large intestine without complications: Secondary | ICD-10-CM | POA: Diagnosis not present

## 2015-12-13 DIAGNOSIS — K5080 Crohn's disease of both small and large intestine without complications: Secondary | ICD-10-CM | POA: Diagnosis not present

## 2015-12-20 DIAGNOSIS — K5080 Crohn's disease of both small and large intestine without complications: Secondary | ICD-10-CM | POA: Diagnosis not present

## 2016-01-11 DIAGNOSIS — Z79899 Other long term (current) drug therapy: Secondary | ICD-10-CM | POA: Diagnosis not present

## 2016-01-11 DIAGNOSIS — K5080 Crohn's disease of both small and large intestine without complications: Secondary | ICD-10-CM | POA: Diagnosis not present

## 2016-01-18 DIAGNOSIS — Z79899 Other long term (current) drug therapy: Secondary | ICD-10-CM | POA: Diagnosis not present

## 2016-02-14 DIAGNOSIS — Z79899 Other long term (current) drug therapy: Secondary | ICD-10-CM | POA: Diagnosis not present

## 2016-02-14 DIAGNOSIS — K5080 Crohn's disease of both small and large intestine without complications: Secondary | ICD-10-CM | POA: Diagnosis not present

## 2016-03-17 DIAGNOSIS — B360 Pityriasis versicolor: Secondary | ICD-10-CM | POA: Diagnosis not present

## 2016-03-17 DIAGNOSIS — L650 Telogen effluvium: Secondary | ICD-10-CM | POA: Diagnosis not present

## 2016-04-24 DIAGNOSIS — E559 Vitamin D deficiency, unspecified: Secondary | ICD-10-CM | POA: Diagnosis not present

## 2016-04-24 DIAGNOSIS — N183 Chronic kidney disease, stage 3 (moderate): Secondary | ICD-10-CM | POA: Diagnosis not present

## 2016-04-24 DIAGNOSIS — I10 Essential (primary) hypertension: Secondary | ICD-10-CM | POA: Diagnosis not present

## 2016-04-24 DIAGNOSIS — Z79899 Other long term (current) drug therapy: Secondary | ICD-10-CM | POA: Diagnosis not present

## 2016-04-24 DIAGNOSIS — D509 Iron deficiency anemia, unspecified: Secondary | ICD-10-CM | POA: Diagnosis not present

## 2016-04-29 DIAGNOSIS — N250 Renal osteodystrophy: Secondary | ICD-10-CM | POA: Diagnosis not present

## 2016-04-29 DIAGNOSIS — K5090 Crohn's disease, unspecified, without complications: Secondary | ICD-10-CM | POA: Diagnosis not present

## 2016-04-29 DIAGNOSIS — N183 Chronic kidney disease, stage 3 (moderate): Secondary | ICD-10-CM | POA: Diagnosis not present

## 2016-04-29 DIAGNOSIS — D649 Anemia, unspecified: Secondary | ICD-10-CM | POA: Diagnosis not present

## 2016-08-26 DIAGNOSIS — E559 Vitamin D deficiency, unspecified: Secondary | ICD-10-CM | POA: Diagnosis not present

## 2016-08-26 DIAGNOSIS — D509 Iron deficiency anemia, unspecified: Secondary | ICD-10-CM | POA: Diagnosis not present

## 2016-08-26 DIAGNOSIS — R809 Proteinuria, unspecified: Secondary | ICD-10-CM | POA: Diagnosis not present

## 2016-08-26 DIAGNOSIS — N183 Chronic kidney disease, stage 3 (moderate): Secondary | ICD-10-CM | POA: Diagnosis not present

## 2016-08-27 DIAGNOSIS — K5080 Crohn's disease of both small and large intestine without complications: Secondary | ICD-10-CM | POA: Diagnosis not present

## 2016-08-27 DIAGNOSIS — Z79899 Other long term (current) drug therapy: Secondary | ICD-10-CM | POA: Diagnosis not present

## 2016-09-02 DIAGNOSIS — N183 Chronic kidney disease, stage 3 (moderate): Secondary | ICD-10-CM | POA: Diagnosis not present

## 2016-09-02 DIAGNOSIS — N250 Renal osteodystrophy: Secondary | ICD-10-CM | POA: Diagnosis not present

## 2016-09-02 DIAGNOSIS — K5090 Crohn's disease, unspecified, without complications: Secondary | ICD-10-CM | POA: Diagnosis not present

## 2016-09-02 DIAGNOSIS — D509 Iron deficiency anemia, unspecified: Secondary | ICD-10-CM | POA: Diagnosis not present

## 2016-09-25 DIAGNOSIS — M25562 Pain in left knee: Secondary | ICD-10-CM | POA: Diagnosis not present

## 2016-11-20 DIAGNOSIS — Z23 Encounter for immunization: Secondary | ICD-10-CM | POA: Diagnosis not present

## 2017-03-02 DIAGNOSIS — K5080 Crohn's disease of both small and large intestine without complications: Secondary | ICD-10-CM | POA: Diagnosis not present

## 2017-03-02 DIAGNOSIS — Z79899 Other long term (current) drug therapy: Secondary | ICD-10-CM | POA: Diagnosis not present

## 2017-05-01 ENCOUNTER — Ambulatory Visit: Payer: BLUE CROSS/BLUE SHIELD | Admitting: Family Medicine

## 2017-05-01 VITALS — BP 122/76 | Wt 193.5 lb

## 2017-05-01 DIAGNOSIS — R599 Enlarged lymph nodes, unspecified: Secondary | ICD-10-CM | POA: Diagnosis not present

## 2017-05-01 NOTE — Progress Notes (Signed)
   Subjective:    Patient ID: Antonio Gallegos, male    DOB: 1980-06-14, 37 y.o.   MRN: [id number]  HPI  Patient arrives with a knot on left shoulder for about 2 days. Patient denies any type of high fever chills sweats he denies any type of night sweats.  No change in appetite.  His energy level is good.  He is on medication for colitis that can affect his immune system He denies any headaches cough wheezing denies any injury Review of Systems Please see above no high fever chills sweats no night sweats no change in appetite no loss of weight    Objective:   Physical Exam Lungs clear respiratory rate is normal heart is regular no murmurs patient has good muscularity has what appears to be a smooth nodule probable lymph node in the left AC joint region approximately 1 and half centimeter by 1 cm x 3 4 cm smooth edge freely movable not fixed  This does not feel like a lipoma There is a family history of lymphoma with his mother   Patient is followed at [facility] gastroenterology for his colitis Assessment & Plan:  Lymph node Referral to general surgery Patient is highly motivated to get definitive answer on what this is. Options including MRI, FNA, biopsy were discussed Patient prefers to have biopsy We will go ahead and connect with general surgery to get him in as soon as possible to get to the bottom of what is causing this

## 2017-05-05 ENCOUNTER — Telehealth: Payer: Self-pay | Admitting: Family Medicine

## 2017-05-05 NOTE — Telephone Encounter (Signed)
Appointment general surgery for lymph node

## 2017-05-07 ENCOUNTER — Encounter: Payer: Self-pay | Admitting: General Surgery

## 2017-05-07 ENCOUNTER — Ambulatory Visit: Payer: BLUE CROSS/BLUE SHIELD | Admitting: General Surgery

## 2017-05-07 VITALS — BP 157/93 | HR 70 | Temp 98.6°F | Ht 68.0 in | Wt 191.0 lb

## 2017-05-07 DIAGNOSIS — R599 Enlarged lymph nodes, unspecified: Secondary | ICD-10-CM

## 2017-05-07 NOTE — Patient Instructions (Signed)
Lymphadenopathy Lymphadenopathy refers to swollen or enlarged lymph glands, also called lymph nodes. Lymph glands are part of your body's defense (immune) system, which protects the body from infections, germs, and diseases. Lymph glands are found in many locations in your body, including the neck, underarm, and groin. Many things can cause lymph glands to become enlarged. When your immune system responds to germs, such as viruses or bacteria, infection-fighting cells and fluid build up. This causes the glands to grow in size. Usually, this is not something to worry about. The swelling and any soreness often go away without treatment. However, swollen lymph glands can also be caused by a number of diseases. Your health care provider may do various tests to help determine the cause. If the cause of your swollen lymph glands cannot be found, it is important to monitor your condition to make sure the swelling goes away. Follow these instructions at home: Watch your condition for any changes. The following actions may help to lessen any discomfort you are feeling:  Get plenty of rest.  Take medicines only as directed by your health care provider. Your health care provider may recommend over-the-counter medicines for pain.  Apply moist heat compresses to the site of swollen lymph nodes as directed by your health care provider. This can help reduce any pain.  Check your lymph nodes daily for any changes.  Keep all follow-up visits as directed by your health care provider. This is important.  Contact a health care provider if:  Your lymph nodes are still swollen after 2 weeks.  Your swelling increases or spreads to other areas.  Your lymph nodes are hard, seem fixed to the skin, or are growing rapidly.  Your skin over the lymph nodes is red and inflamed.  You have a fever.  You have chills.  You have fatigue.  You develop a sore throat.  You have abdominal pain.  You have weight  loss.  You have night sweats. Get help right away if:  You notice fluid leaking from the area of the enlarged lymph node.  You have severe pain in any area of your body.  You have chest pain.  You have shortness of breath. This information is not intended to replace advice given to you by your health care provider. Make sure you discuss any questions you have with your health care provider. Document Released: 10/02/2007 Document Revised: 05/31/2015 Document Reviewed: 07/28/2013 Elsevier Interactive Patient Education  2018 Elsevier Inc.  

## 2017-05-07 NOTE — Progress Notes (Signed)
TOA MIA; [id number]; [birth year]   HPI Patient is a 37 year old white male who was referred to my care by Dr. Lilyan Punt for evaluation and treatment of a swollen lymph node in the left supraclavicular area.  Patient just noticed it last week.  He denies any fever, chills, trauma, or recent weight loss.  He does have a family history of lymphoma in his mother.  He does also have a history of proctocolitis.  He currently has no pain.  He states he just noticed the swollen lymph node last week.  It is not particularly tender to touch.  He has 0 out of 10 pain. Past Medical History:  Diagnosis Date  . Proctocolitis without complication AUG 2015    Past Surgical History:  Procedure Laterality Date  . ANTERIOR CRUCIATE LIGAMENT REPAIR Left 2004   [facility] [facility]  . COLONOSCOPY N/A 09/05/2013   Dr. Fields:Normal mucosa in the terminal ileum; multiple biopsies/PROCTOCOLITIS DUE to Crohn's disease. Ileal biopsy consistent with Crohn's disease     Family History  Problem Relation Age of Onset  . Colon cancer Neg Hx   . Celiac disease Neg Hx   . Lymphoma Mother     Current Outpatient Medications on File Prior to Visit  Medication Sig Dispense Refill  . budesonide (ENTOCORT EC) 3 MG 24 hr capsule Take 3 capsules (9 mg total) by mouth daily. (Patient not taking: Reported on 05/01/2015) 90 capsule 3  . Olopatadine HCl 0.2 % SOLN Apply 1 drop to eye at bedtime as needed. (Patient not taking: Reported on 05/01/2017) 1 Bottle 0  . PENTASA 500 MG CR capsule TAKE 2 CAPSULES BY MOUTH THREE TIMES DAILY. (Patient not taking: Reported on 05/01/2017) 180 capsule 5   No current facility-administered medications on file prior to visit.     Allergies  Allergen Reactions  . Azithromycin     Stomach pains  . Other     Pollen     Social History   Substance and Sexual Activity  Alcohol Use Yes   Comment: Ocassionally    Social History   Tobacco Use  Smoking Status Former Smoker   Smokeless Tobacco Never Used  Tobacco Comment   Quit x 10 years    Review of Systems  Constitutional: Negative.   HENT: Negative.   Eyes: Negative.   Respiratory: Negative.   Cardiovascular: Negative.   Gastrointestinal: Negative.   Genitourinary: Negative.   Musculoskeletal: Negative.   Skin: Negative.   Neurological: Negative.   Endo/Heme/Allergies: Negative.   Psychiatric/Behavioral: Negative.     Objective   Vitals:   05/07/17 1140  BP: (!) 157/93  Pulse: 70  Temp: 98.6 F (37 C)    Physical Exam  Constitutional: He is oriented to person, place, and time. He appears well-developed and well-nourished.  HENT:  Head: Normocephalic and atraumatic.  Cardiovascular: Normal rate, regular rhythm and normal heart sounds. Exam reveals no gallop and no friction rub.  No murmur heard. Pulmonary/Chest: Effort normal and breath sounds normal. No stridor. No respiratory distress. He has no wheezes. He has no rales.  1.5 cm ovoid subcutaneous mass in the left supraclavicular region over [facility] joint.  Nontender.  Abdominal: Soft. Bowel sounds are normal.  Shotty bilateral inguinal lymphadenopathy.  Lymphadenopathy:    He has no cervical adenopathy.  Neurological: He is alert and oriented to person, place, and time.  Skin: Skin is warm and dry.  No axillary lymphadenopathy  Vitals reviewed.  Dr. Fletcher Anon notes reviewed.  Assessment  Enlarged lymph node, left supraclavicular Plan   I told the patient that we should wait on any biopsy given the acuteness of the finding.  Pathologic analysis of an acutely swollen lymph node is sometimes more indeterminate in nature.  Would wait 4 weeks and then reassess.  He understands and agrees.  We will follow-up in my office in 1 month.

## 2017-05-07 NOTE — Telephone Encounter (Signed)
Jenkins-seen by Dr. Lovell Sheehan  will recheck him again in a month

## 2017-05-13 ENCOUNTER — Telehealth: Payer: Self-pay | Admitting: Family Medicine

## 2017-05-13 NOTE — Telephone Encounter (Signed)
I spoke with the patient regarding this area behind his left ear.  I am requesting that we put him in the schedule for Friday morning at 9 AM.  Please block off my open 9:30 AM appointment since we are taking on a second patient at 9 AM.   thank you-the patient is aware of this appointment

## 2017-05-13 NOTE — Telephone Encounter (Signed)
Patient is requesting that Dr. Lorin Picket give him a call back regarding a follow up question he has from his visit on 05/01/17.  He has had a bump show up directly behind his ear and would like to discuss this with him.

## 2017-05-14 NOTE — Telephone Encounter (Signed)
Appointment scheduled.

## 2017-05-15 ENCOUNTER — Encounter: Payer: Self-pay | Admitting: Family Medicine

## 2017-05-15 ENCOUNTER — Ambulatory Visit: Payer: BLUE CROSS/BLUE SHIELD | Admitting: Family Medicine

## 2017-05-15 VITALS — BP 118/76 | Ht 68.0 in | Wt 193.0 lb

## 2017-05-15 DIAGNOSIS — L723 Sebaceous cyst: Secondary | ICD-10-CM | POA: Diagnosis not present

## 2017-05-15 NOTE — Progress Notes (Signed)
   Subjective:    Patient ID: Antonio Gallegos, male    DOB: 1980/08/06, 37 y.o.   MRN: [id number]  HPI  Patient arrives with bump behind his ear. Patient concerned about a small bump behind his left ear. Denies any injury States overall feels he is doing fairly good He is concerned about it though because he did have an area on his left shoulder that is being followed by general surgery.  He is never noticed this area behind his ear before.  No fever sweats no night sweats no weight loss Review of Systems The please see above denies any recent HEENT infections    Objective:   Physical Exam  Respiratory rate is normal no crackles heart regular no murmurs Small bump noted behind the left ear with deep palpation appears to be a developing sebaceous cyst not infected No other head or neck growths were detected on physical exam of his neck and head    Assessment & Plan:  Developing sebaceous cyst behind left ear I do not feel this is cancer Certainly it needs to be watched If progressively larger may need addressing by general surgery Will send a copy of this to Dr. Franky Macho Warning signs regarding infection was discussed

## 2017-06-09 ENCOUNTER — Encounter: Payer: Self-pay | Admitting: General Surgery

## 2017-06-09 ENCOUNTER — Other Ambulatory Visit: Payer: Self-pay | Admitting: General Surgery

## 2017-06-09 ENCOUNTER — Ambulatory Visit: Payer: BLUE CROSS/BLUE SHIELD | Admitting: General Surgery

## 2017-06-09 VITALS — BP 143/90 | HR 76 | Temp 98.0°F | Resp 18 | Wt 192.0 lb

## 2017-06-09 DIAGNOSIS — R599 Enlarged lymph nodes, unspecified: Secondary | ICD-10-CM

## 2017-06-09 NOTE — Progress Notes (Signed)
Subjective:     Antonio Gallegos  Here for follow-up of swollen subcutaneous lump, left supraclavicular region.  Patient has not noticed a change.  Is nontender.  No growth has been noted.  Denies any fevers. Objective:    BP (!) 143/90 (BP Location: Left Arm, Patient Position: Sitting, Cuff Size: Normal)   Pulse 76   Temp 98 F (36.7 C) (Temporal)   Resp 18   Wt 192 lb (87.1 kg)   BMI 29.19 kg/m   General:  alert, cooperative and no distress  Left supraclavicular/shoulder region with 1.5 cm ovoid subcutaneous mass which is mobile.  Unchanged from previous examination.     Assessment:    Possible lymph node enlargement, left supraclavicular region.    Plan:  Will get ultrasound of the subcutaneous mass.  Further management pending those results.  Will follow-up here after ultrasound.

## 2017-06-12 ENCOUNTER — Telehealth: Payer: Self-pay | Admitting: Family Medicine

## 2017-06-12 NOTE — Telephone Encounter (Signed)
Antonio Gallegos believes he has contracted poison ivy.  He said he has a corticosteroid at home and wants to know if he would be able to use this?

## 2017-06-12 NOTE — Telephone Encounter (Signed)
ntsw what kind of corticosteroid, ehere is rash, how bad, etc? We can call in oral pred pk if he wants

## 2017-06-12 NOTE — Telephone Encounter (Signed)
Left message to return call 

## 2017-06-12 NOTE — Telephone Encounter (Signed)
Please advise 

## 2017-06-15 ENCOUNTER — Other Ambulatory Visit: Payer: Self-pay | Admitting: *Deleted

## 2017-06-15 ENCOUNTER — Ambulatory Visit (HOSPITAL_COMMUNITY)
Admission: RE | Admit: 2017-06-15 | Discharge: 2017-06-15 | Disposition: A | Discharge status: Home / Self Care | Payer: BLUE CROSS/BLUE SHIELD | Source: Ambulatory Visit | Attending: General Surgery | Admitting: General Surgery

## 2017-06-15 DIAGNOSIS — M7989 Other specified soft tissue disorders: Secondary | ICD-10-CM | POA: Diagnosis not present

## 2017-06-15 DIAGNOSIS — R599 Enlarged lymph nodes, unspecified: Secondary | ICD-10-CM | POA: Insufficient documentation

## 2017-06-15 MED ORDER — TRIAMCINOLONE ACETONIDE 0.1 % EX CREA: 1.0000 "application " | TOPICAL_CREAM | Freq: Two times a day (BID) | CUTANEOUS | 1 refills | Status: DC

## 2017-06-15 NOTE — Telephone Encounter (Signed)
discussed with pt. Pt verbalized understanding. Med sent to pharm.

## 2017-06-15 NOTE — Telephone Encounter (Signed)
Itchy rash on stomach, chest, one leg both wrist. It has spread since last Friday when he called. Not on face. He is using calmine lotion and cortisone lotion. He has desonide cream at home but has not used because not use if he should use it on poison ivy. He does not want prednisone because he can't sleep when he takes it. Is it anything else he can use  [place] [facility].

## 2017-06-15 NOTE — Telephone Encounter (Signed)
I would use something a little more strong than that, triamicolone 0.1 per cent 60 g plus one ref, bid to affected area

## 2017-06-15 NOTE — Telephone Encounter (Signed)
Left message to return call 

## 2017-09-17 DIAGNOSIS — Z79899 Other long term (current) drug therapy: Secondary | ICD-10-CM | POA: Diagnosis not present

## 2017-09-17 DIAGNOSIS — K5080 Crohn's disease of both small and large intestine without complications: Secondary | ICD-10-CM | POA: Diagnosis not present

## 2017-10-22 DIAGNOSIS — Z23 Encounter for immunization: Secondary | ICD-10-CM | POA: Diagnosis not present

## 2018-08-16 ENCOUNTER — Other Ambulatory Visit: Payer: Self-pay

## 2018-08-17 ENCOUNTER — Ambulatory Visit (INDEPENDENT_AMBULATORY_CARE_PROVIDER_SITE_OTHER): Payer: BC Managed Care – PPO | Admitting: Family Medicine

## 2018-08-17 DIAGNOSIS — H00014 Hordeolum externum left upper eyelid: Secondary | ICD-10-CM | POA: Diagnosis not present

## 2018-08-17 MED ORDER — CEPHALEXIN 500 MG PO CAPS: 500.0000 mg | ORAL_CAPSULE | Freq: Four times a day (QID) | ORAL | 0 refills | Status: DC

## 2018-08-17 NOTE — Progress Notes (Signed)
   Subjective:    Patient ID: Antonio Gallegos, male    DOB: 09/13/1980, 38 y.o.   MRN: [id number]  HPI  Patient calls with stye in his left eyes since  Thurs of last week.  Patient relates is been present over the left eye since last Thursday somewhat tender somewhat red denies any injury to it.  Is not had this before tried warm compresses nothing really seems to be helping PMH benign Virtual Visit via Video Note  I connected with [name] on 08/17/18 at  1:10 PM EDT by a video enabled telemedicine application and verified that I am speaking with the correct person using two identifiers.  Location: Patient: home Provider: office   I discussed the limitations of evaluation and management by telemedicine and the availability of in person appointments. The patient expressed understanding and agreed to proceed.  History of Present Illness:    Observations/Objective:   Assessment and Plan:   Follow Up Instructions:    I discussed the assessment and treatment plan with the patient. The patient was provided an opportunity to ask questions and all were answered. The patient agreed with the plan and demonstrated an understanding of the instructions.   The patient was advised to call back or seek an in-person evaluation if the symptoms worsen or if the condition fails to improve as anticipated.  I provided 12 minutes of non-face-to-face time during this encounter.      Review of Systems  Constitutional: Negative for activity change, fatigue and fever.  HENT: Negative for congestion and rhinorrhea.   Eyes: Negative for pain, discharge and itching.  Respiratory: Negative for cough and shortness of breath.   Cardiovascular: Negative for chest pain and leg swelling.  Gastrointestinal: Negative for abdominal pain, diarrhea and nausea.  Genitourinary: Negative for dysuria and hematuria.  Neurological: Negative for weakness and headaches.  Psychiatric/Behavioral: Negative for  agitation and behavioral problems.       Objective:   Physical Exam  Patient had virtual visit Appears to be in no distress Atraumatic Neuro able to relate and oriented No apparent resp distress Color normal Obvious stye left eye      Assessment & Plan:  Stye left eyelid Recommend warm compresses frequently Keflex 4 times daily for 7 days Warning signs discussed  Wellness exam somewhere within the next year along with screening labs recommended

## 2018-09-01 ENCOUNTER — Other Ambulatory Visit: Payer: Self-pay

## 2018-09-01 ENCOUNTER — Ambulatory Visit (INDEPENDENT_AMBULATORY_CARE_PROVIDER_SITE_OTHER): Payer: BC Managed Care – PPO | Admitting: Family Medicine

## 2018-09-01 DIAGNOSIS — H6120 Impacted cerumen, unspecified ear: Secondary | ICD-10-CM | POA: Diagnosis not present

## 2018-09-01 DIAGNOSIS — H7292 Unspecified perforation of tympanic membrane, left ear: Secondary | ICD-10-CM | POA: Diagnosis not present

## 2018-09-01 DIAGNOSIS — Z6828 Body mass index (BMI) 28.0-28.9, adult: Secondary | ICD-10-CM | POA: Diagnosis not present

## 2018-09-01 DIAGNOSIS — H6122 Impacted cerumen, left ear: Secondary | ICD-10-CM | POA: Diagnosis not present

## 2018-09-01 DIAGNOSIS — H6121 Impacted cerumen, right ear: Secondary | ICD-10-CM | POA: Diagnosis not present

## 2018-09-01 NOTE — Progress Notes (Signed)
   Subjective:  Audio plus visual  Patient ID: Antonio Gallegos, male    DOB: 01-Aug-1980, 38 y.o.   MRN: [id number]  HPI Pt is having wax buildup in left ear.Stopped up yesterday; tried to clean ear out with Qtip and did see a little blood. [place] [facility] [place] and was told that it could be impacted or the ear drum could have ruptured. Pt states he would like an ENT referral.   Virtual Visit via Video Note  I connected with Antonio Gallegos on 09/01/18 at  2:00 PM EDT by a video enabled telemedicine application and verified that I am speaking with the correct person using two identifiers.  Location: Patient: home Provider: office   I discussed the limitations of evaluation and management by telemedicine and the availability of in person appointments. The patient expressed understanding and agreed to proceed.  History of Present Illness:    Observations/Objective:   Assessment and Plan:   Follow Up Instructions:    I discussed the assessment and treatment plan with the patient. The patient was provided an opportunity to ask questions and all were answered. The patient agreed with the plan and demonstrated an understanding of the instructions.   The patient was advised to call back or seek an in-person evaluation if the symptoms worsen or if the condition fails to improve as anticipated.  I provided 17 minutes of non-face-to-face time during this encounter.   Vicente Males, LPN  See prior notes.  Went swimming.  Felt that his earwax seal well.  Try to use a Q-tip.  Got subsequent blood.  Went on to urgent care.  They describe potential for eardrum injury.  Patient still cannot hear.  Was told there was substantial wax.  Review of Systems No headache no sore throat no chest pain    Objective:   Physical Exam  Virtual      Assessment & Plan:  Impression cerumen impaction with likely external canal injury.  Highly doubt TM injury rationale discussed.  Symptom  care discussed.  We will work on ENT referral for patient

## 2018-09-02 ENCOUNTER — Encounter: Payer: Self-pay | Admitting: Family Medicine

## 2018-09-03 ENCOUNTER — Encounter: Payer: Self-pay | Admitting: Family Medicine

## 2018-09-30 ENCOUNTER — Ambulatory Visit (INDEPENDENT_AMBULATORY_CARE_PROVIDER_SITE_OTHER): Payer: BC Managed Care – PPO | Admitting: Family Medicine

## 2018-09-30 DIAGNOSIS — K098 Other cysts of oral region, not elsewhere classified: Secondary | ICD-10-CM

## 2018-09-30 NOTE — Progress Notes (Signed)
   Subjective:    Patient ID: Antonio Gallegos, male    DOB: 1980-01-28, 38 y.o.   MRN: [id number]  HPI  Patient calls with sore in mouth for several days. It looks like it has water in the bump. Patient relates a cyst on his lower lip it is becoming to get larger and larger not tender painful denies any injury to it.  Never had this before. Review of Systems  Constitutional: Negative for activity change, chills and fever.  HENT: Negative for congestion, ear pain and rhinorrhea.   Eyes: Negative for discharge.  Respiratory: Negative for cough and wheezing.   Cardiovascular: Negative for chest pain.  Gastrointestinal: Negative for nausea and vomiting.  Musculoskeletal: Negative for arthralgias.   Virtual Visit via Video Note  I connected with Antonio Gallegos on 09/30/18 at 10:30 AM EDT by a video enabled telemedicine application and verified that I am speaking with the correct person using two identifiers.  Location: Patient: home Provider: office   I discussed the limitations of evaluation and management by telemedicine and the availability of in person appointments. The patient expressed understanding and agreed to proceed.  History of Present Illness:    Observations/Objective:   Assessment and Plan:   Follow Up Instructions:    I discussed the assessment and treatment plan with the patient. The patient was provided an opportunity to ask questions and all were answered. The patient agreed with the plan and demonstrated an understanding of the instructions.   The patient was advised to call back or seek an in-person evaluation if the symptoms worsen or if the condition fails to improve as anticipated.  I provided 15 minutes of non-face-to-face time during this encounter.  15 minutes was spent with patient today discussing healthcare issues which they came.  More than 50% of this visit-total duration of visit-was spent in counseling and coordination of care.  Please see  diagnosis regarding the focus of this coordination and care      Objective:   Physical Exam On physical exam he does have what appears to be a cyst in the lower lip there is not any other appearance of any other nodules this was all done by video       Assessment & Plan:  Cyst on lower lip Referral to ENT More than likely have to have it surgically removed If ongoing troubles or problems notify us

## 2018-10-07 DIAGNOSIS — K5080 Crohn's disease of both small and large intestine without complications: Secondary | ICD-10-CM | POA: Diagnosis not present

## 2018-11-03 DIAGNOSIS — Z79899 Other long term (current) drug therapy: Secondary | ICD-10-CM | POA: Diagnosis not present

## 2018-11-03 DIAGNOSIS — K5080 Crohn's disease of both small and large intestine without complications: Secondary | ICD-10-CM | POA: Diagnosis not present

## 2019-03-14 DIAGNOSIS — B351 Tinea unguium: Secondary | ICD-10-CM | POA: Diagnosis not present

## 2019-04-21 DIAGNOSIS — Z23 Encounter for immunization: Secondary | ICD-10-CM | POA: Diagnosis not present

## 2019-05-13 DIAGNOSIS — Z23 Encounter for immunization: Secondary | ICD-10-CM | POA: Diagnosis not present

## 2019-07-12 DIAGNOSIS — D225 Melanocytic nevi of trunk: Secondary | ICD-10-CM | POA: Diagnosis not present

## 2019-07-12 DIAGNOSIS — L814 Other melanin hyperpigmentation: Secondary | ICD-10-CM | POA: Diagnosis not present

## 2019-09-13 DIAGNOSIS — K5080 Crohn's disease of both small and large intestine without complications: Secondary | ICD-10-CM | POA: Diagnosis not present

## 2019-10-13 DIAGNOSIS — K5080 Crohn's disease of both small and large intestine without complications: Secondary | ICD-10-CM | POA: Diagnosis not present

## 2020-04-29 DIAGNOSIS — H6123 Impacted cerumen, bilateral: Secondary | ICD-10-CM | POA: Diagnosis not present

## 2020-11-06 DIAGNOSIS — Z23 Encounter for immunization: Secondary | ICD-10-CM | POA: Diagnosis not present

## 2020-11-06 DIAGNOSIS — L814 Other melanin hyperpigmentation: Secondary | ICD-10-CM | POA: Diagnosis not present

## 2020-11-06 DIAGNOSIS — D1801 Hemangioma of skin and subcutaneous tissue: Secondary | ICD-10-CM | POA: Diagnosis not present

## 2020-11-06 DIAGNOSIS — L578 Other skin changes due to chronic exposure to nonionizing radiation: Secondary | ICD-10-CM | POA: Diagnosis not present

## 2020-12-14 ENCOUNTER — Ambulatory Visit (INDEPENDENT_AMBULATORY_CARE_PROVIDER_SITE_OTHER): Payer: BC Managed Care – PPO | Admitting: Family Medicine

## 2020-12-14 ENCOUNTER — Encounter: Payer: Self-pay | Admitting: Family Medicine

## 2020-12-14 ENCOUNTER — Other Ambulatory Visit: Payer: Self-pay

## 2020-12-14 VITALS — BP 119/80 | HR 59 | Temp 97.2°F | Ht 68.0 in | Wt 188.0 lb

## 2020-12-14 DIAGNOSIS — Z0000 Encounter for general adult medical examination without abnormal findings: Secondary | ICD-10-CM

## 2020-12-14 DIAGNOSIS — Z23 Encounter for immunization: Secondary | ICD-10-CM | POA: Diagnosis not present

## 2020-12-14 DIAGNOSIS — N289 Disorder of kidney and ureter, unspecified: Secondary | ICD-10-CM

## 2020-12-14 DIAGNOSIS — Z114 Encounter for screening for human immunodeficiency virus [HIV]: Secondary | ICD-10-CM

## 2020-12-14 DIAGNOSIS — K50919 Crohn's disease, unspecified, with unspecified complications: Secondary | ICD-10-CM | POA: Diagnosis not present

## 2020-12-14 DIAGNOSIS — Z1159 Encounter for screening for other viral diseases: Secondary | ICD-10-CM | POA: Diagnosis not present

## 2020-12-14 DIAGNOSIS — Z1321 Encounter for screening for nutritional disorder: Secondary | ICD-10-CM

## 2020-12-14 NOTE — Progress Notes (Signed)
   Subjective:    Patient ID: Antonio Gallegos, male    DOB: 07/29/1980, 40 y.o.   MRN: [id number]  HPI  The patient comes in today for a wellness visit. Does not smoke does not drink stays physically active tries to eat healthy denies being depressed tries to stay safe sees gastroenterology once yearly for Crohn's disease   A review of their health history was completed.  A review of medications was also completed.  Any needed refills; no  Eating habits: excellent  Falls/  MVA accidents in past few months: no  Regular exercise: cardio, weights, golf  Specialist pt sees on regular basis: endocrinology, [facility]  Preventative health issues were discussed.   Additional concerns: no  Review of Systems     Objective:   Physical Exam General-in no acute distress Eyes-no discharge Lungs-respiratory rate normal, CTA CV-no murmurs,RRR Extremities skin warm dry no edema Neuro grossly normal Behavior normal, alert  Testicles normal has benign skin condition related to the skin pores on the scrotum in 2 spots      Assessment & Plan:  Adult wellness-complete.wellness physical was conducted today. Importance of diet and exercise were discussed in detail.  In addition to this a discussion regarding safety was also covered. We also reviewed over immunizations and gave recommendations regarding current immunization needed for age.  In addition to this additional areas were also touched on including: Preventative health exams needed:  Colonoscopy not indicated  Patient was advised yearly wellness exam Lab work ordered Follow-up yearly

## 2020-12-15 LAB — CBC WITH DIFFERENTIAL/PLATELET
Basophils Absolute: 0 10*3/uL (ref 0.0–0.2)
Basos: 1 %
EOS (ABSOLUTE): 0.1 10*3/uL (ref 0.0–0.4)
Eos: 1 %
Hematocrit: 39.9 % (ref 37.5–51.0)
Hemoglobin: 13.4 g/dL (ref 13.0–17.7)
Immature Grans (Abs): 0 10*3/uL (ref 0.0–0.1)
Immature Granulocytes: 0 %
Lymphocytes Absolute: 1 10*3/uL (ref 0.7–3.1)
Lymphs: 25 %
MCH: 31.4 pg (ref 26.6–33.0)
MCHC: 33.6 g/dL (ref 31.5–35.7)
MCV: 93 fL (ref 79–97)
Monocytes Absolute: 0.3 10*3/uL (ref 0.1–0.9)
Monocytes: 8 %
Neutrophils Absolute: 2.6 10*3/uL (ref 1.4–7.0)
Neutrophils: 65 %
Platelets: 225 10*3/uL (ref 150–450)
RBC: 4.27 x10E6/uL (ref 4.14–5.80)
RDW: 13.1 % (ref 11.6–15.4)
WBC: 4 10*3/uL (ref 3.4–10.8)

## 2020-12-15 LAB — COMPREHENSIVE METABOLIC PANEL
ALT: 12 IU/L (ref 0–44)
AST: 15 IU/L (ref 0–40)
Albumin/Globulin Ratio: 1.6 (ref 1.2–2.2)
Albumin: 4.5 g/dL (ref 4.0–5.0)
Alkaline Phosphatase: 54 IU/L (ref 44–121)
BUN/Creatinine Ratio: 12 (ref 9–20)
BUN: 26 mg/dL — ABNORMAL HIGH (ref 6–24)
Bilirubin Total: <0.2 mg/dL (ref 0.0–1.2)
CO2: 24 mmol/L (ref 20–29)
Calcium: 9.7 mg/dL (ref 8.7–10.2)
Chloride: 103 mmol/L (ref 96–106)
Creatinine, Ser: 2.12 mg/dL — ABNORMAL HIGH (ref 0.76–1.27)
Globulin, Total: 2.9 g/dL (ref 1.5–4.5)
Glucose: 87 mg/dL (ref 70–99)
Potassium: 4.5 mmol/L (ref 3.5–5.2)
Sodium: 142 mmol/L (ref 134–144)
Total Protein: 7.4 g/dL (ref 6.0–8.5)
eGFR: 40 mL/min/{1.73_m2} — ABNORMAL LOW (ref >59)

## 2020-12-15 LAB — LIPID PANEL
Chol/HDL Ratio: 5.3 ratio — ABNORMAL HIGH (ref 0.0–5.0)
Cholesterol, Total: 234 mg/dL — ABNORMAL HIGH (ref 100–199)
HDL: 44 mg/dL (ref >39)
LDL Chol Calc (NIH): 173 mg/dL — ABNORMAL HIGH (ref 0–99)
Triglycerides: 96 mg/dL (ref 0–149)
VLDL Cholesterol Cal: 17 mg/dL (ref 5–40)

## 2020-12-15 LAB — VITAMIN D 25 HYDROXY (VIT D DEFICIENCY, FRACTURES): Vit D, 25-Hydroxy: 43.8 ng/mL (ref 30.0–100.0)

## 2020-12-15 LAB — TSH: TSH: 1.75 u[IU]/mL (ref 0.450–4.500)

## 2020-12-15 LAB — HIV ANTIBODY (ROUTINE TESTING W REFLEX): HIV Screen 4th Generation wRfx: NONREACTIVE

## 2020-12-15 LAB — HEPATITIS C ANTIBODY: Hep C Virus Ab: 0.1 s/co ratio (ref 0.0–0.9)

## 2020-12-17 ENCOUNTER — Ambulatory Visit (INDEPENDENT_AMBULATORY_CARE_PROVIDER_SITE_OTHER): Payer: BC Managed Care – PPO | Admitting: Family Medicine

## 2020-12-17 ENCOUNTER — Other Ambulatory Visit: Payer: Self-pay

## 2020-12-17 DIAGNOSIS — N1831 Chronic kidney disease, stage 3a: Secondary | ICD-10-CM | POA: Diagnosis not present

## 2020-12-17 DIAGNOSIS — E785 Hyperlipidemia, unspecified: Secondary | ICD-10-CM | POA: Diagnosis not present

## 2020-12-17 NOTE — Progress Notes (Signed)
Patient scheduled telephone visit 12/17/20 at 4:50pm with Dr Lorin Picket

## 2020-12-17 NOTE — Progress Notes (Signed)
   Subjective:    Patient ID: Antonio Gallegos, male    DOB: 06-Jan-1981, 40 y.o.   MRN: [id number]  HPI  Patient calls to discuss recent labs. The 10-year ASCVD risk score (Arnett DK, et al., 2019) is: 5.3%   Values used to calculate the score:     Age: 32 years     Sex: Male     Is Non-Hispanic African American: No     Diabetic: No     Tobacco smoker: Yes     Systolic Blood Pressure: 119 mmHg     Is BP treated: No     HDL Cholesterol: 44 mg/dL     Total Cholesterol: 234 mg/dL   Virtual Visit via Telephone Note  I connected with Antonio Gallegos on 12/17/20 at  4:50 PM EST by telephone and verified that I am speaking with the correct person using two identifiers.  Location: Patient: home Provider: office   I discussed the limitations, risks, security and privacy concerns of performing an evaluation and management service by telephone and the availability of in person appointments. I also discussed with the patient that there may be a patient responsible charge related to this service. The patient expressed understanding and agreed to proceed.   History of Present Illness:    Observations/Objective:   Assessment and Plan:   Follow Up Instructions:    I discussed the assessment and treatment plan with the patient. The patient was provided an opportunity to ask questions and all were answered. The patient agreed with the plan and demonstrated an understanding of the instructions.   The patient was advised to call back or seek an in-person evaluation if the symptoms worsen or if the condition fails to improve as anticipated.  I provided 15 minutes of non-face-to-face time during this encounter.     Review of Systems     Objective:   Physical Exam  Today's visit was via telephone Physical exam was not possible for this visit       Assessment & Plan:  CKD - due to damage from chrons meds, GFR stable continue as is Check yearly avoid protein powders Pt uses  collagen powder- we will look into safety with CKD  LDL way up- ascvd 5.3% Will touch base with cardiology, possible room for cardiac calcium score vs watching vs meds  Is hard to know the amount of protein in collagen powder we will send the patient a message regarding this so he can give [place] feedback

## 2020-12-23 ENCOUNTER — Encounter: Payer: Self-pay | Admitting: Family Medicine

## 2021-01-03 NOTE — Telephone Encounter (Signed)
Nurses-please set up coronary calcium score CAT scan at [facility] can also be done at [facility] imaging which may be closer to where he lives-feel free to connect with him regarding this  (Test is not covered by insurance typically low cost, insurance companies do not consider this medically necessary.  He can certainly appeal to his insurance company to try to get it covered but typically it is considered to be a optional test-healthcare systems try to make this affordable)

## 2021-01-03 NOTE — Telephone Encounter (Signed)
Sent message to patient to get his preference on location

## 2021-01-17 DIAGNOSIS — K5080 Crohn's disease of both small and large intestine without complications: Secondary | ICD-10-CM | POA: Diagnosis not present

## 2021-03-01 ENCOUNTER — Encounter: Payer: Self-pay | Admitting: Family Medicine

## 2021-03-04 NOTE — Telephone Encounter (Signed)
Nurses-you may share this message with Gurvir Please let Quintan know that the referral was sent to [facility] imaging apparently they have never reached out to him. Please provide Brandn the number for [facility] imaging they should be able to schedule him directly When he schedules that he needs to make sure he lets them know that we are his family doctor so that they send the results to [place] Typically we get results back within 2 to 3 days of the test so usually we are able to give some sort of feedback within 1 week if he has not heard within 1 week for him to connect with me  Finally there is more definitive lipid profiles that can be done that look at a significant amount of the LDL particles but I would recommend pursuing the coronary calcium test first.  If he is interested in having a more definitive lipid profile panel ordered we can order this through [facility].  She would just need to let us know.  Thanks-Dr. Lorin Picket

## 2021-03-20 ENCOUNTER — Other Ambulatory Visit: Payer: Self-pay

## 2021-04-12 ENCOUNTER — Ambulatory Visit
Admission: RE | Admit: 2021-04-12 | Discharge: 2021-04-12 | Disposition: A | Discharge status: Home / Self Care | Payer: No Typology Code available for payment source | Source: Ambulatory Visit | Attending: Family Medicine | Admitting: Family Medicine

## 2021-04-12 DIAGNOSIS — Z0389 Encounter for observation for other suspected diseases and conditions ruled out: Secondary | ICD-10-CM | POA: Diagnosis not present

## 2021-04-15 ENCOUNTER — Other Ambulatory Visit: Payer: Self-pay

## 2021-04-15 DIAGNOSIS — E785 Hyperlipidemia, unspecified: Secondary | ICD-10-CM

## 2021-05-13 ENCOUNTER — Encounter: Payer: Self-pay | Admitting: Family Medicine

## 2021-05-27 ENCOUNTER — Encounter: Payer: Self-pay | Admitting: Family Medicine

## 2021-06-03 ENCOUNTER — Encounter: Payer: Self-pay | Admitting: Family Medicine

## 2021-06-03 NOTE — Telephone Encounter (Signed)
A letter was dictated according to his request.  It should be available via MyChart.  If he needs something more detailed I would recommend an office visit so that we can properly discuss the situation and document treatment plans.

## 2021-06-06 DIAGNOSIS — E785 Hyperlipidemia, unspecified: Secondary | ICD-10-CM | POA: Diagnosis not present

## 2021-06-07 LAB — LIPID PANEL
Chol/HDL Ratio: 5.1 ratio — ABNORMAL HIGH (ref 0.0–5.0)
Cholesterol, Total: 211 mg/dL — ABNORMAL HIGH (ref 100–199)
HDL: 41 mg/dL (ref >39)
LDL Chol Calc (NIH): 157 mg/dL — ABNORMAL HIGH (ref 0–99)
Triglycerides: 74 mg/dL (ref 0–149)
VLDL Cholesterol Cal: 13 mg/dL (ref 5–40)

## 2021-06-11 ENCOUNTER — Encounter: Payer: Self-pay | Admitting: Family Medicine

## 2021-06-11 ENCOUNTER — Ambulatory Visit: Payer: BC Managed Care – PPO | Admitting: Family Medicine

## 2021-06-11 VITALS — BP 100/74 | HR 63 | Temp 97.7°F | Wt 186.0 lb

## 2021-06-11 DIAGNOSIS — Z79899 Other long term (current) drug therapy: Secondary | ICD-10-CM

## 2021-06-11 DIAGNOSIS — N1831 Chronic kidney disease, stage 3a: Secondary | ICD-10-CM | POA: Diagnosis not present

## 2021-06-11 DIAGNOSIS — N289 Disorder of kidney and ureter, unspecified: Secondary | ICD-10-CM | POA: Diagnosis not present

## 2021-06-11 DIAGNOSIS — E785 Hyperlipidemia, unspecified: Secondary | ICD-10-CM

## 2021-06-11 NOTE — Progress Notes (Signed)
   Subjective:    Patient ID: Antonio Gallegos, male    DOB: 04/05/1980, 41 y.o.   MRN: [id number]  HPI Pt arrives today for follow up. No issues or concerns today.  Patient denies any chest tightness pressure pain shortness of breath Trying to eat healthy  The 10-year ASCVD risk score (Arnett DK, et al., 2019) is: 1.3%   Values used to calculate the score:     Age: 54 years     Sex: Male     Is Non-Hispanic African American: No     Diabetic: No     Tobacco smoker: No     Systolic Blood Pressure: 99991111 mmHg     Is BP treated: No     HDL Cholesterol: 41 mg/dL     Total Cholesterol: 211 mg/dL   Review of Systems     Objective:   Physical Exam General-in no acute distress Eyes-no discharge Lungs-respiratory rate normal, CTA CV-no murmurs,RRR Extremities skin warm dry no edema Neuro grossly normal Behavior normal, alert        Assessment & Plan:  1. High risk medication use Lab work ordered await results - Lipid Profile - Hepatic function panel - Basic Metabolic Panel (BMET) - Urine Microalbumin w/creat. ratio  2. Hyperlipidemia, unspecified hyperlipidemia type Patient does have elevated lipid for now he is decided to hold off on statins.  More than likely at some point in time he will need to utilize statins to lower his risk for heart disease.  Patient will think about this over the next several months.  Repeat blood work again in the fall time - Lipid Profile - Hepatic function panel - Basic Metabolic Panel (BMET) - Urine Microalbumin w/creat. ratio  3. Stage 3a chronic kidney disease ([place]) Patient does have chronic kidney disease repeat lab work by fall time - Lipid Profile - Hepatic function panel - Basic Metabolic Panel (BMET) - Urine Microalbumin w/creat. ratio  5. Renal insufficiency Labs ordered including urine micro protein - Lipid Profile - Hepatic function panel - Basic Metabolic Panel (BMET) - Urine Microalbumin w/creat. ratio  Coronary calcium  score shows low level of calcium reassuring.  Patient still at increased risk of coronary artery disease later in life but not currently

## 2021-07-12 DIAGNOSIS — M792 Neuralgia and neuritis, unspecified: Secondary | ICD-10-CM | POA: Diagnosis not present

## 2021-07-12 DIAGNOSIS — M7672 Peroneal tendinitis, left leg: Secondary | ICD-10-CM | POA: Diagnosis not present

## 2021-08-16 DIAGNOSIS — M792 Neuralgia and neuritis, unspecified: Secondary | ICD-10-CM | POA: Diagnosis not present

## 2021-11-06 DIAGNOSIS — M792 Neuralgia and neuritis, unspecified: Secondary | ICD-10-CM | POA: Diagnosis not present

## 2022-02-24 DIAGNOSIS — K5080 Crohn's disease of both small and large intestine without complications: Secondary | ICD-10-CM | POA: Diagnosis not present

## 2022-02-24 DIAGNOSIS — Z7960 Long term (current) use of unspecified immunomodulators and immunosuppressants: Secondary | ICD-10-CM | POA: Diagnosis not present

## 2022-03-04 DIAGNOSIS — K5080 Crohn's disease of both small and large intestine without complications: Secondary | ICD-10-CM | POA: Diagnosis not present

## 2022-03-04 DIAGNOSIS — Z7960 Long term (current) use of unspecified immunomodulators and immunosuppressants: Secondary | ICD-10-CM | POA: Diagnosis not present

## 2022-04-01 ENCOUNTER — Ambulatory Visit: Payer: BC Managed Care – PPO | Admitting: Family Medicine

## 2022-07-18 DIAGNOSIS — K5080 Crohn's disease of both small and large intestine without complications: Secondary | ICD-10-CM | POA: Diagnosis not present

## 2022-07-18 DIAGNOSIS — Z7960 Long term (current) use of unspecified immunomodulators and immunosuppressants: Secondary | ICD-10-CM | POA: Diagnosis not present

## 2022-07-18 DIAGNOSIS — Z79899 Other long term (current) drug therapy: Secondary | ICD-10-CM | POA: Diagnosis not present

## 2022-11-13 IMAGING — CT CT CARDIAC CORONARY ARTERY CALCIUM SCORE
3 series · 14 of 20 positions shown, 16 images · non-contrast
Comparison: None.

CLINICAL DATA: 40-year-old white male when he was

EXAM:
CT CARDIAC CORONARY ARTERY CALCIUM SCORE
TECHNIQUE: Non-contrast imaging through the heart was performed using
prospective ECG gating. Image post processing was performed on an
independent workstation, allowing for quantitative analysis of the
heart and coronary arteries. Note that this exam targets the heart
and the chest was not imaged in its entirety.

[Series 2: calcium scoring 2.00 qr36 bestdiast 69% hrt calciu · axial · 0.41mm/px · z∈[+1528,+1612]mm · 4 of 70 slices shown]
[im 14/70  vessel]
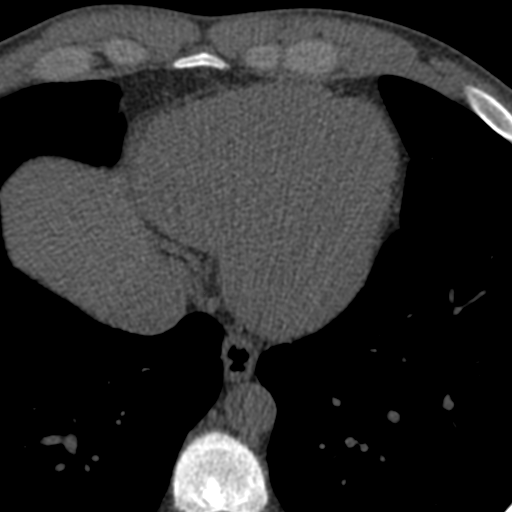
[im 28/70  vessel]
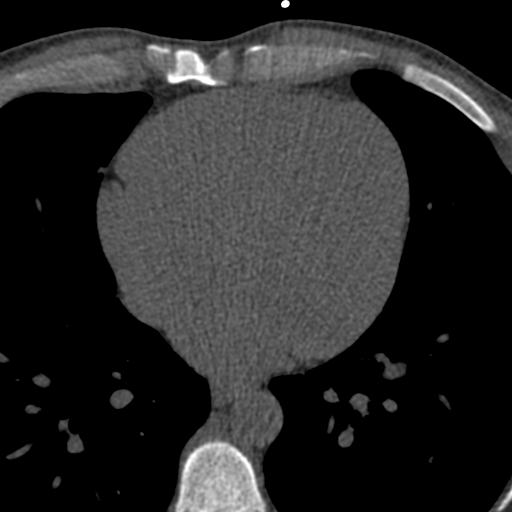
[im 42/70  vessel]
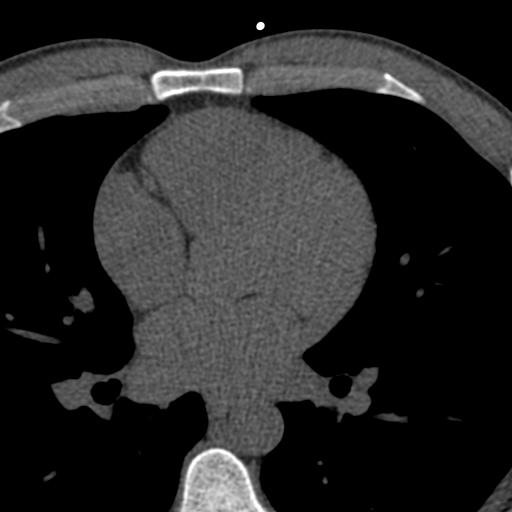
[im 56/70  vessel]
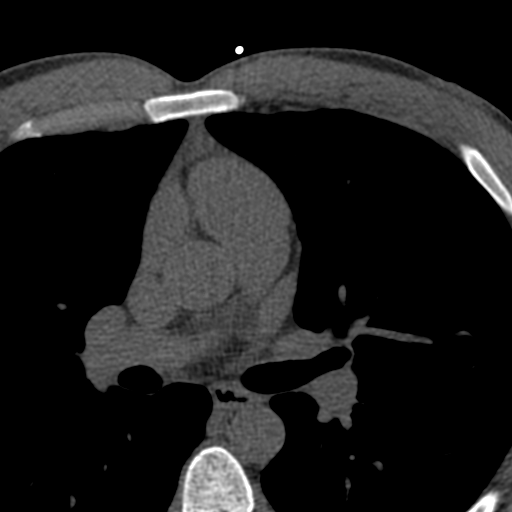

[Series 3: calcium scoring 2.00 br40 bestdiast 69% axial · axial · 0.57mm/px · z∈[+1524,+1616]mm · 5 of 70 slices shown, 7 images]
[im 12/70  vessel]
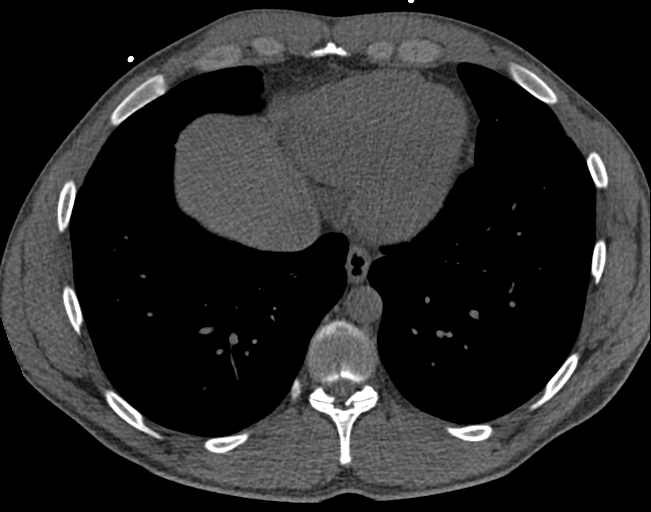
[im 12/70  lung]
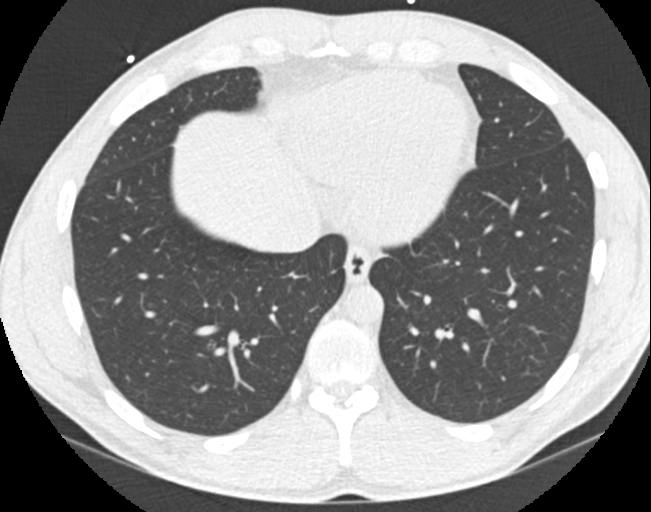
[im 24/70  vessel]
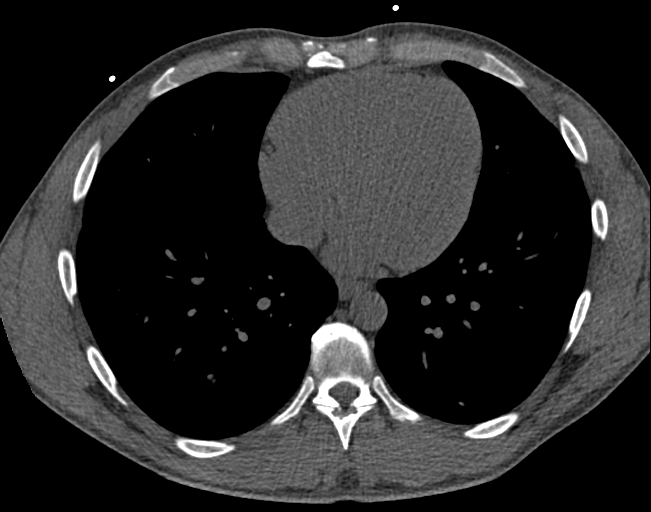
[im 35/70  vessel]
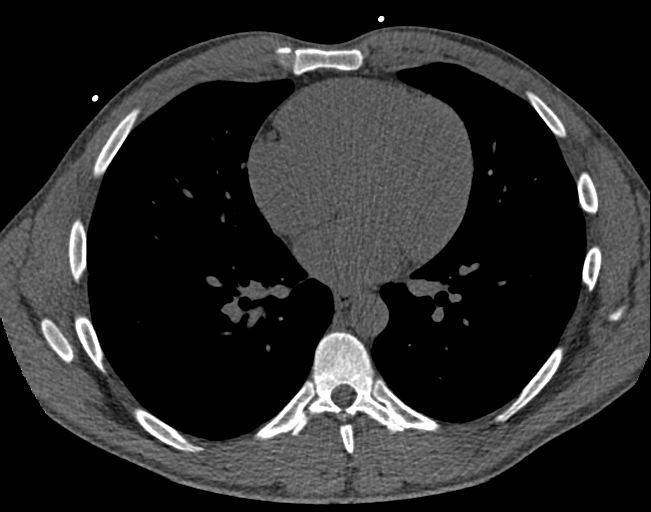
[im 47/70  vessel]
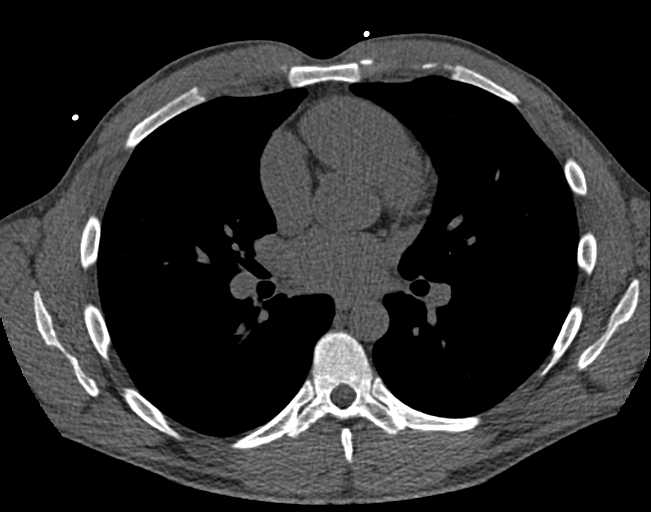
[im 58/70  vessel]
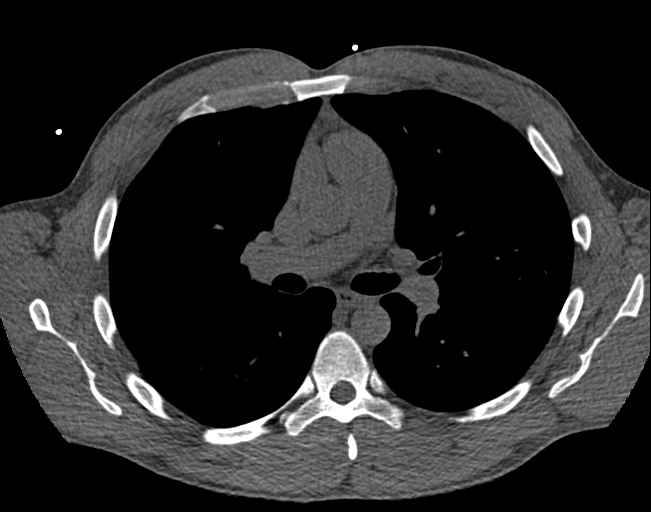
[im 58/70  lung]
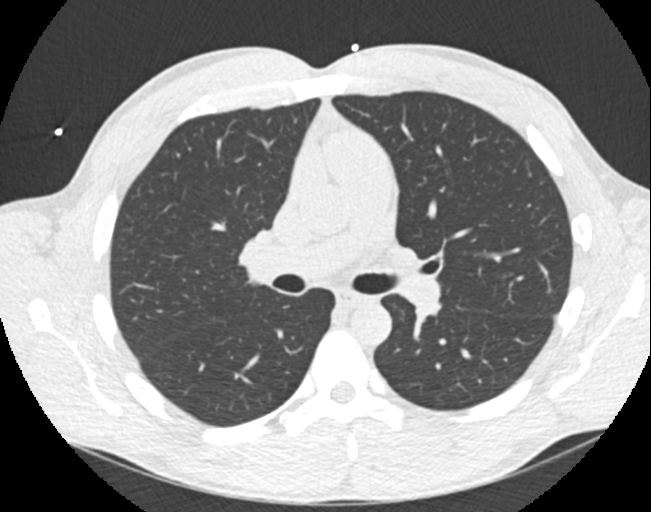

[Series 9: calcium scoring 2.00 br60 bestdiast 69% lungs · axial · 0.58mm/px · z∈[+1524,+1616]mm · 5 of 70 slices shown]
[im 12/70  vessel]
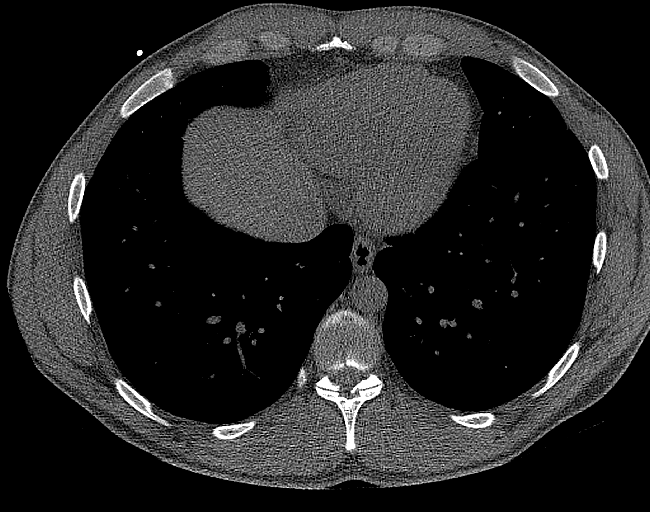
[im 24/70  vessel]
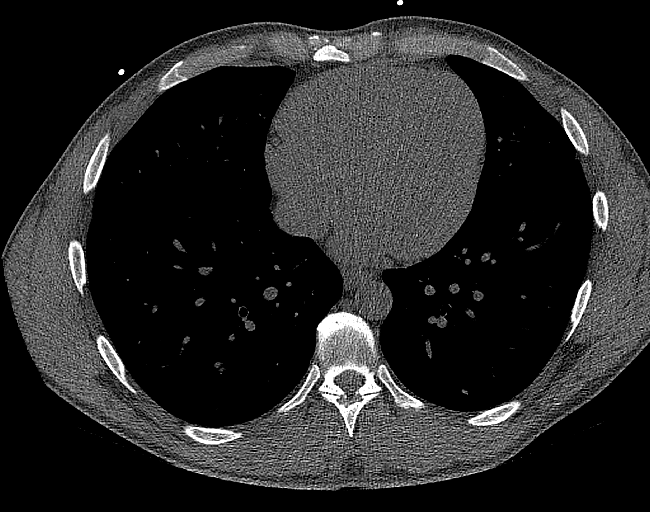
[im 35/70  vessel]
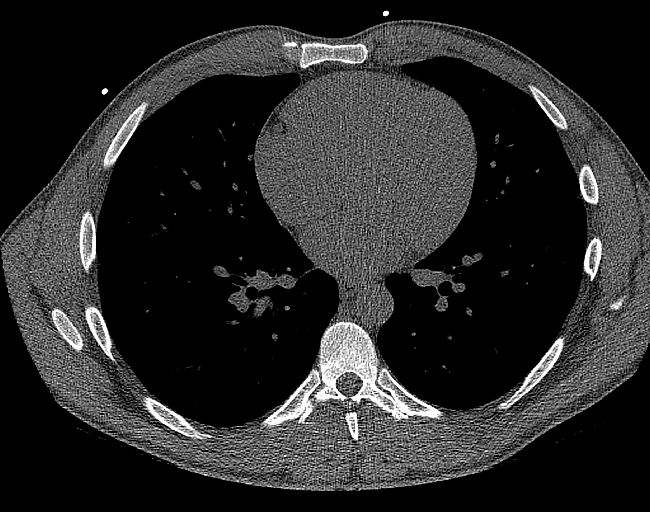
[im 47/70  vessel]
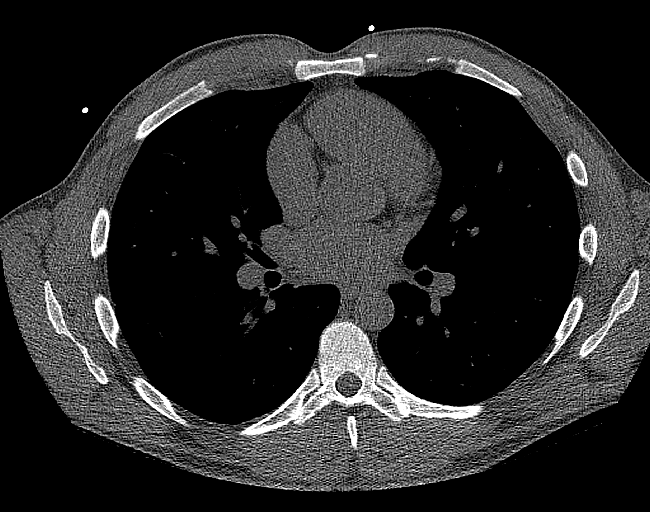
[im 58/70  vessel]
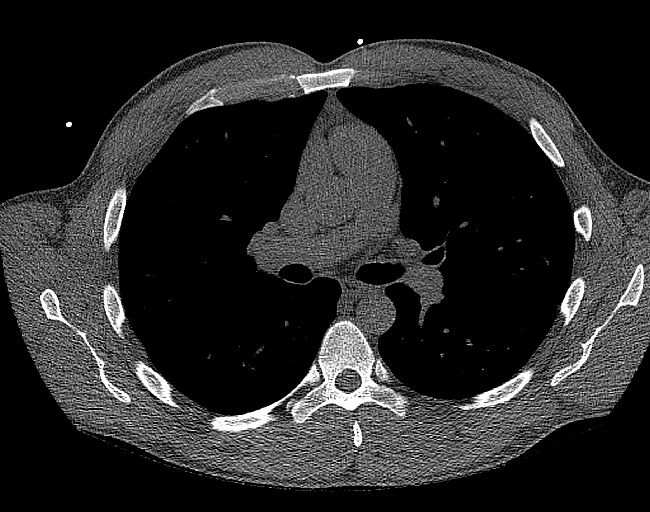

[14 of 20 positions shown; findings below may reference images not displayed]

FINDINGS: CORONARY CALCIUM SCORES:

Left Main: 0

LAD: 0

LCx: 0

RCA:

Total Agatston Score:

[HOSPITAL] percentile: Patient age out of range

AORTA MEASUREMENTS:

Ascending Aorta: 27 mm

Descending Aorta: 25 mm

OTHER FINDINGS:

Right heart. No pericardial effusion. Normal caliber thoracic aorta
with no atherosclerotic disease. Esophagus is unremarkable. No
pathologically enlarged lymph nodes seen in the chest. Mild soft
tissue of the anterior mediastinum with interspersed fat, compatible
with residual thymus. Central airways are patent. No consolidation,
pleural effusion or pneumothorax. Visualized upper abdomen is
unremarkable. No aggressive appearing osseous lesions.
IMPRESSION: Total Agatston Score:

## 2023-02-05 DIAGNOSIS — Z79899 Other long term (current) drug therapy: Secondary | ICD-10-CM | POA: Diagnosis not present

## 2023-02-09 ENCOUNTER — Encounter: Payer: Self-pay | Admitting: Family Medicine

## 2023-02-10 ENCOUNTER — Telehealth: Payer: Self-pay | Admitting: *Deleted

## 2023-02-10 DIAGNOSIS — Z79899 Other long term (current) drug therapy: Secondary | ICD-10-CM

## 2023-02-10 DIAGNOSIS — N289 Disorder of kidney and ureter, unspecified: Secondary | ICD-10-CM

## 2023-02-10 DIAGNOSIS — E785 Hyperlipidemia, unspecified: Secondary | ICD-10-CM

## 2023-02-10 NOTE — Telephone Encounter (Signed)
Labs for upcoming physical- last labs 06/2021

## 2023-02-10 NOTE — Telephone Encounter (Signed)
Lipid, liver, metabolic 7, urine ACR Hyperlipidemia, wellness, elevated creatinine

## 2023-02-10 NOTE — Telephone Encounter (Signed)
Blood work ordered in EPIC. Patient notified via my chart 

## 2023-04-17 DIAGNOSIS — N289 Disorder of kidney and ureter, unspecified: Secondary | ICD-10-CM | POA: Diagnosis not present

## 2023-04-17 DIAGNOSIS — E785 Hyperlipidemia, unspecified: Secondary | ICD-10-CM | POA: Diagnosis not present

## 2023-04-18 LAB — BASIC METABOLIC PANEL WITH GFR
BUN/Creatinine Ratio: 15 (ref 9–20)
BUN: 29 mg/dL — ABNORMAL HIGH (ref 6–24)
CO2: 23 mmol/L (ref 20–29)
Calcium: 9.6 mg/dL (ref 8.7–10.2)
Chloride: 99 mmol/L (ref 96–106)
Creatinine, Ser: 1.92 mg/dL — ABNORMAL HIGH (ref 0.76–1.27)
Glucose: 94 mg/dL (ref 70–99)
Potassium: 4.3 mmol/L (ref 3.5–5.2)
Sodium: 138 mmol/L (ref 134–144)
eGFR: 44 mL/min/{1.73_m2} — ABNORMAL LOW (ref >59)

## 2023-04-18 LAB — MICROALBUMIN / CREATININE URINE RATIO
Creatinine, Urine: 123.5 mg/dL
Microalb/Creat Ratio: <2 mg/g{creat} (ref 0–29)
Microalbumin, Urine: <3 ug/mL

## 2023-04-18 LAB — HEPATIC FUNCTION PANEL
ALT: 13 IU/L (ref 0–44)
AST: 16 IU/L (ref 0–40)
Albumin: 4.4 g/dL (ref 4.1–5.1)
Alkaline Phosphatase: 55 IU/L (ref 44–121)
Bilirubin Total: <0.2 mg/dL (ref 0.0–1.2)
Bilirubin, Direct: <0.08 mg/dL (ref 0.00–0.40)
Total Protein: 7.5 g/dL (ref 6.0–8.5)

## 2023-04-18 LAB — LIPID PANEL
Chol/HDL Ratio: 6.1 ratio — ABNORMAL HIGH (ref 0.0–5.0)
Cholesterol, Total: 215 mg/dL — ABNORMAL HIGH (ref 100–199)
HDL: 35 mg/dL — ABNORMAL LOW (ref >39)
LDL Chol Calc (NIH): 152 mg/dL — ABNORMAL HIGH (ref 0–99)
Triglycerides: 151 mg/dL — ABNORMAL HIGH (ref 0–149)
VLDL Cholesterol Cal: 28 mg/dL (ref 5–40)

## 2023-04-24 ENCOUNTER — Ambulatory Visit: Payer: BC Managed Care – PPO | Admitting: Family Medicine

## 2023-04-24 ENCOUNTER — Encounter: Payer: Self-pay | Admitting: Family Medicine

## 2023-04-24 VITALS — BP 113/77 | HR 69 | Temp 98.1°F | Ht 68.0 in | Wt 180.0 lb

## 2023-04-24 DIAGNOSIS — E785 Hyperlipidemia, unspecified: Secondary | ICD-10-CM

## 2023-04-24 DIAGNOSIS — Z0001 Encounter for general adult medical examination with abnormal findings: Secondary | ICD-10-CM | POA: Diagnosis not present

## 2023-04-24 DIAGNOSIS — Z79899 Other long term (current) drug therapy: Secondary | ICD-10-CM

## 2023-04-24 DIAGNOSIS — N1831 Chronic kidney disease, stage 3a: Secondary | ICD-10-CM

## 2023-04-24 DIAGNOSIS — Z0000 Encounter for general adult medical examination without abnormal findings: Secondary | ICD-10-CM

## 2023-04-24 MED ORDER — ROSUVASTATIN CALCIUM 10 MG PO TABS: 10.0000 mg | ORAL_TABLET | Freq: Every day | ORAL | 3 refills | Status: AC

## 2023-04-24 NOTE — Progress Notes (Signed)
 Subjective:    Patient ID: Antonio Gallegos, male    DOB: 1980-02-26, 43 y.o.   MRN: [id number]  HPI The patient comes in today for a wellness visit.    A review of their health history was completed.  A review of medications was also completed.  Any needed refills; none today  Eating habits: Very healthy eating pattern  Falls/  MVA accidents in past few months: No accidents or injuries tries to be a safe person  Regular exercise: It is in a mild amount of exercise  Specialist pt sees on regular basis: Gastroenterologist for Crohn's disease  Preventative health issues were discussed.   Additional concerns: None  Did do recent labs these were reviewed with patient Results for orders placed or performed in visit on 02/10/23  Lipid panel   Collection Time: 04/17/23  9:06 AM  Result Value Ref Range   Cholesterol, Total 215 (H) 100 - 199 mg/dL   Triglycerides 865 (H) 0 - 149 mg/dL   HDL 35 (L) >78 mg/dL   VLDL Cholesterol Cal 28 5 - 40 mg/dL   LDL Chol Calc (NIH) 469 (H) 0 - 99 mg/dL   Chol/HDL Ratio 6.1 (H) 0.0 - 5.0 ratio  Microalbumin / creatinine urine ratio   Collection Time: 04/17/23  9:06 AM  Result Value Ref Range   Creatinine, Urine 123.5 Not Estab. mg/dL   Microalbumin, Urine <6.2 Not Estab. ug/mL   Microalb/Creat Ratio <2 0 - 29 mg/g creat  Basic metabolic panel   Collection Time: 04/17/23  9:06 AM  Result Value Ref Range   Glucose 94 70 - 99 mg/dL   BUN 29 (H) 6 - 24 mg/dL   Creatinine, Ser 9.52 (H) 0.76 - 1.27 mg/dL   eGFR 44 (L) >84 XL/KGM/0.10   BUN/Creatinine Ratio 15 9 - 20   Sodium 138 134 - 144 mmol/L   Potassium 4.3 3.5 - 5.2 mmol/L   Chloride 99 96 - 106 mmol/L   CO2 23 20 - 29 mmol/L   Calcium  9.6 8.7 - 10.2 mg/dL  Hepatic function panel   Collection Time: 04/17/23  9:06 AM  Result Value Ref Range   Total Protein 7.5 6.0 - 8.5 g/dL   Albumin 4.4 4.1 - 5.1 g/dL   Bilirubin Total <2.7 0.0 - 1.2 mg/dL   Bilirubin, Direct <2.53 0.00 - 0.40  mg/dL   Alkaline Phosphatase 55 44 - 121 IU/L   AST 16 0 - 40 IU/L   ALT 13 0 - 44 IU/L       Review of Systems     Objective:   Physical Exam  General-in no acute distress Eyes-no discharge Lungs-respiratory rate normal, CTA CV-no murmurs,RRR Extremities skin warm dry no edema Neuro grossly normal Behavior normal, alert       Assessment & Plan:  1. Well adult exam (Primary) Adult wellness-complete.wellness physical was conducted today. Importance of diet and exercise were discussed in detail.  Importance of stress reduction and healthy living were discussed.  In addition to this a discussion regarding safety was also covered.  We also reviewed over immunizations and gave recommendations regarding current immunization needed for age.   In addition to this additional areas were also touched on including: Preventative health exams needed:  Colonoscopy not indicated currently will start with his gastroenterologist at [facility] at age 63  Patient was advised yearly wellness exam   2. Stage 3a chronic kidney disease (HCC) Stable.  Healthy diet recommended.  Keep blood pressure  under good control.  Recheck this again in 1 years time - Hepatic function panel  3. Hyperlipidemia, unspecified hyperlipidemia type Moderate elevation Patient at increased risk of heart disease because of elevation Not in a abnormal range Previous coronary calcium  minimal calcification After shared discussion we will go ahead and start cholesterol medicine rosuvastatin  10 mg daily Side effects were discussed Patient will do lab work in approximately 8 to 10 weeks He will follow-up sooner if any problems Recheck again in 1 years time - Lipid panel

## 2023-04-24 NOTE — Progress Notes (Deleted)
 Labs in late June

## 2023-05-31 ENCOUNTER — Encounter: Payer: Self-pay | Admitting: Family Medicine

## 2023-06-02 ENCOUNTER — Other Ambulatory Visit: Payer: Self-pay

## 2023-06-02 DIAGNOSIS — E785 Hyperlipidemia, unspecified: Secondary | ICD-10-CM

## 2023-06-02 DIAGNOSIS — N1831 Chronic kidney disease, stage 3a: Secondary | ICD-10-CM

## 2023-06-02 DIAGNOSIS — Z79899 Other long term (current) drug therapy: Secondary | ICD-10-CM

## 2023-06-02 DIAGNOSIS — T148XXA Other injury of unspecified body region, initial encounter: Secondary | ICD-10-CM

## 2023-06-02 NOTE — Telephone Encounter (Signed)
 Nurses I read through the message that [facility] sent  It would be helpful if Celedonio would send this the approximate dimensions of the bruise.  As long as this is a relatively small bruise and there are no other bruises I doubt that it is related to the statin.  Also a relatively small bruise does not warrant blood work.  But at the same time if the bruising in that area becomes dramatically larger or multiple other bruises start appearing other places it would be very important to get seen and evaluated plus blood work  Also nurses-please addendum his lab work to be lipid, liver, CBC With diagnosis for CBC hematoma that would allow us  to check his hemoglobin and platelet count.  Nemiah can feel free to send us  a reply to the above plus also if the area gets dramatically bigger or starts having other bruising for no good reason then needs to be seen.  Thanks-Dr. Geralyn Knee

## 2023-07-03 DIAGNOSIS — E785 Hyperlipidemia, unspecified: Secondary | ICD-10-CM | POA: Diagnosis not present

## 2023-07-03 DIAGNOSIS — Z79899 Other long term (current) drug therapy: Secondary | ICD-10-CM | POA: Diagnosis not present

## 2023-07-03 DIAGNOSIS — T148XXA Other injury of unspecified body region, initial encounter: Secondary | ICD-10-CM | POA: Diagnosis not present

## 2023-07-03 DIAGNOSIS — N1831 Chronic kidney disease, stage 3a: Secondary | ICD-10-CM | POA: Diagnosis not present

## 2023-07-04 ENCOUNTER — Ambulatory Visit: Payer: Self-pay | Admitting: Family Medicine

## 2023-07-04 LAB — LIPID PANEL
Chol/HDL Ratio: 3.3 ratio (ref 0.0–5.0)
Cholesterol, Total: 145 mg/dL (ref 100–199)
HDL: 44 mg/dL (ref >39)
LDL Chol Calc (NIH): 81 mg/dL (ref 0–99)
Triglycerides: 109 mg/dL (ref 0–149)
VLDL Cholesterol Cal: 20 mg/dL (ref 5–40)

## 2023-07-04 LAB — CBC WITH DIFFERENTIAL/PLATELET
Basophils Absolute: 0 10*3/uL (ref 0.0–0.2)
Basos: 1 %
EOS (ABSOLUTE): 0.1 10*3/uL (ref 0.0–0.4)
Eos: 2 %
Hematocrit: 42.6 % (ref 37.5–51.0)
Hemoglobin: 13.5 g/dL (ref 13.0–17.7)
Immature Grans (Abs): 0 10*3/uL (ref 0.0–0.1)
Immature Granulocytes: 0 %
Lymphocytes Absolute: 1.2 10*3/uL (ref 0.7–3.1)
Lymphs: 25 %
MCH: 30.3 pg (ref 26.6–33.0)
MCHC: 31.7 g/dL (ref 31.5–35.7)
MCV: 96 fL (ref 79–97)
Monocytes Absolute: 0.4 10*3/uL (ref 0.1–0.9)
Monocytes: 8 %
Neutrophils Absolute: 3 10*3/uL (ref 1.4–7.0)
Neutrophils: 64 %
Platelets: 233 10*3/uL (ref 150–450)
RBC: 4.45 x10E6/uL (ref 4.14–5.80)
RDW: 12.9 % (ref 11.6–15.4)
WBC: 4.7 10*3/uL (ref 3.4–10.8)

## 2023-07-04 LAB — HEPATIC FUNCTION PANEL
ALT: 18 IU/L (ref 0–44)
AST: 22 IU/L (ref 0–40)
Albumin: 4.6 g/dL (ref 4.1–5.1)
Alkaline Phosphatase: 53 IU/L (ref 44–121)
Bilirubin Total: 0.2 mg/dL (ref 0.0–1.2)
Bilirubin, Direct: 0.1 mg/dL (ref 0.00–0.40)
Total Protein: 7.7 g/dL (ref 6.0–8.5)

## 2024-04-25 ENCOUNTER — Encounter: Admitting: Family Medicine
